# Patient Record
Sex: Male | Born: 1945 | Race: White | Hispanic: No | Marital: Married | Smoking: Never smoker
Health system: Southern US, Community
[De-identification: ages and names within clinical notes are randomized; demographics above are authoritative.]

## PROBLEM LIST (undated history)

## (undated) DIAGNOSIS — G4700 Insomnia, unspecified: Secondary | ICD-10-CM

## (undated) DIAGNOSIS — G4730 Sleep apnea, unspecified: Secondary | ICD-10-CM

## (undated) DIAGNOSIS — I2510 Atherosclerotic heart disease of native coronary artery without angina pectoris: Secondary | ICD-10-CM

## (undated) DIAGNOSIS — I10 Essential (primary) hypertension: Secondary | ICD-10-CM

## (undated) DIAGNOSIS — K439 Ventral hernia without obstruction or gangrene: Secondary | ICD-10-CM

## (undated) DIAGNOSIS — J449 Chronic obstructive pulmonary disease, unspecified: Secondary | ICD-10-CM

## (undated) DIAGNOSIS — K589 Irritable bowel syndrome without diarrhea: Secondary | ICD-10-CM

## (undated) DIAGNOSIS — F329 Major depressive disorder, single episode, unspecified: Secondary | ICD-10-CM

## (undated) DIAGNOSIS — E785 Hyperlipidemia, unspecified: Secondary | ICD-10-CM

## (undated) DIAGNOSIS — F1011 Alcohol abuse, in remission: Secondary | ICD-10-CM

## (undated) DIAGNOSIS — K219 Gastro-esophageal reflux disease without esophagitis: Secondary | ICD-10-CM

## (undated) DIAGNOSIS — K2270 Barrett's esophagus without dysplasia: Secondary | ICD-10-CM

## (undated) DIAGNOSIS — K2970 Gastritis, unspecified, without bleeding: Secondary | ICD-10-CM

## (undated) DIAGNOSIS — F32A Depression, unspecified: Secondary | ICD-10-CM

## (undated) DIAGNOSIS — F419 Anxiety disorder, unspecified: Secondary | ICD-10-CM

## (undated) HISTORY — DX: Gastritis, unspecified, without bleeding: K29.70

## (undated) HISTORY — DX: Sleep apnea, unspecified: G47.30

## (undated) HISTORY — DX: Major depressive disorder, single episode, unspecified: F32.9

## (undated) HISTORY — DX: Irritable bowel syndrome, unspecified: K58.9

## (undated) HISTORY — DX: Hyperlipidemia, unspecified: E78.5

## (undated) HISTORY — DX: Depression, unspecified: F32.A

## (undated) HISTORY — DX: Alcohol abuse, in remission: F10.11

## (undated) HISTORY — DX: Gastro-esophageal reflux disease without esophagitis: K21.9

## (undated) HISTORY — DX: Barrett's esophagus without dysplasia: K22.70

## (undated) HISTORY — PX: NECK SURGERY: SHX720

## (undated) HISTORY — DX: Atherosclerotic heart disease of native coronary artery without angina pectoris: I25.10

## (undated) HISTORY — DX: Chronic obstructive pulmonary disease, unspecified: J44.9

## (undated) HISTORY — DX: Anxiety disorder, unspecified: F41.9

## (undated) HISTORY — PX: ESOPHAGOGASTRODUODENOSCOPY: SHX1529

## (undated) HISTORY — PX: HERNIA REPAIR: SHX51

## (undated) HISTORY — DX: Ventral hernia without obstruction or gangrene: K43.9

## (undated) HISTORY — DX: Insomnia, unspecified: G47.00

## (undated) HISTORY — DX: Essential (primary) hypertension: I10

---

## 1983-05-04 ENCOUNTER — Encounter (INDEPENDENT_AMBULATORY_CARE_PROVIDER_SITE_OTHER): Payer: Self-pay | Admitting: Gastroenterology

## 1983-07-03 HISTORY — PX: NISSEN FUNDOPLICATION: SHX2091

## 1987-05-20 ENCOUNTER — Encounter (INDEPENDENT_AMBULATORY_CARE_PROVIDER_SITE_OTHER): Payer: Self-pay | Admitting: Gastroenterology

## 1988-06-06 ENCOUNTER — Encounter (INDEPENDENT_AMBULATORY_CARE_PROVIDER_SITE_OTHER): Payer: Self-pay | Admitting: Gastroenterology

## 1988-11-27 ENCOUNTER — Encounter (INDEPENDENT_AMBULATORY_CARE_PROVIDER_SITE_OTHER): Payer: Self-pay | Admitting: Gastroenterology

## 1990-02-05 ENCOUNTER — Encounter (INDEPENDENT_AMBULATORY_CARE_PROVIDER_SITE_OTHER): Payer: Self-pay | Admitting: Gastroenterology

## 1992-06-09 ENCOUNTER — Encounter (INDEPENDENT_AMBULATORY_CARE_PROVIDER_SITE_OTHER): Payer: Self-pay | Admitting: Gastroenterology

## 1992-06-10 ENCOUNTER — Encounter (INDEPENDENT_AMBULATORY_CARE_PROVIDER_SITE_OTHER): Payer: Self-pay | Admitting: Gastroenterology

## 1993-07-02 HISTORY — PX: TUMOR REMOVAL: SHX12

## 1993-07-02 HISTORY — PX: COLECTOMY: SHX59

## 1994-04-17 ENCOUNTER — Encounter (INDEPENDENT_AMBULATORY_CARE_PROVIDER_SITE_OTHER): Payer: Self-pay | Admitting: Gastroenterology

## 1994-04-18 ENCOUNTER — Encounter (INDEPENDENT_AMBULATORY_CARE_PROVIDER_SITE_OTHER): Payer: Self-pay | Admitting: Gastroenterology

## 1994-05-30 ENCOUNTER — Encounter (INDEPENDENT_AMBULATORY_CARE_PROVIDER_SITE_OTHER): Payer: Self-pay | Admitting: *Deleted

## 1998-05-20 ENCOUNTER — Other Ambulatory Visit: Admission: RE | Admit: 1998-05-20 | Discharge: 1998-05-20 | Payer: Self-pay | Admitting: Gastroenterology

## 1998-05-20 ENCOUNTER — Encounter (INDEPENDENT_AMBULATORY_CARE_PROVIDER_SITE_OTHER): Payer: Self-pay | Admitting: Gastroenterology

## 2000-03-02 HISTORY — PX: GYNECOMASTIA EXCISION: SHX5170

## 2001-05-01 ENCOUNTER — Ambulatory Visit (HOSPITAL_BASED_OUTPATIENT_CLINIC_OR_DEPARTMENT_OTHER): Admission: RE | Admit: 2001-05-01 | Discharge: 2001-05-01 | Payer: Self-pay | Admitting: Family Medicine

## 2001-07-31 ENCOUNTER — Ambulatory Visit (HOSPITAL_BASED_OUTPATIENT_CLINIC_OR_DEPARTMENT_OTHER): Admission: RE | Admit: 2001-07-31 | Discharge: 2001-07-31 | Payer: Self-pay | Admitting: Pulmonary Disease

## 2004-05-12 ENCOUNTER — Ambulatory Visit: Payer: Self-pay | Admitting: Family Medicine

## 2004-09-27 ENCOUNTER — Ambulatory Visit: Payer: Self-pay | Admitting: Family Medicine

## 2006-04-11 ENCOUNTER — Ambulatory Visit: Payer: Self-pay | Admitting: Family Medicine

## 2006-04-11 LAB — CONVERTED CEMR LAB: PSA: 0.8 ng/mL

## 2007-05-06 ENCOUNTER — Telehealth: Payer: Self-pay | Admitting: Family Medicine

## 2007-06-17 ENCOUNTER — Encounter: Payer: Self-pay | Admitting: Family Medicine

## 2007-06-17 DIAGNOSIS — G4700 Insomnia, unspecified: Secondary | ICD-10-CM

## 2007-06-17 DIAGNOSIS — F411 Generalized anxiety disorder: Secondary | ICD-10-CM | POA: Insufficient documentation

## 2007-06-17 DIAGNOSIS — K219 Gastro-esophageal reflux disease without esophagitis: Secondary | ICD-10-CM

## 2007-06-17 DIAGNOSIS — E785 Hyperlipidemia, unspecified: Secondary | ICD-10-CM

## 2007-06-17 DIAGNOSIS — K439 Ventral hernia without obstruction or gangrene: Secondary | ICD-10-CM | POA: Insufficient documentation

## 2007-06-17 DIAGNOSIS — J309 Allergic rhinitis, unspecified: Secondary | ICD-10-CM | POA: Insufficient documentation

## 2007-06-17 DIAGNOSIS — I10 Essential (primary) hypertension: Secondary | ICD-10-CM | POA: Insufficient documentation

## 2007-06-17 DIAGNOSIS — G4730 Sleep apnea, unspecified: Secondary | ICD-10-CM | POA: Insufficient documentation

## 2007-06-17 DIAGNOSIS — F329 Major depressive disorder, single episode, unspecified: Secondary | ICD-10-CM

## 2007-06-17 DIAGNOSIS — F1011 Alcohol abuse, in remission: Secondary | ICD-10-CM | POA: Insufficient documentation

## 2007-06-17 DIAGNOSIS — K589 Irritable bowel syndrome without diarrhea: Secondary | ICD-10-CM | POA: Insufficient documentation

## 2007-06-18 ENCOUNTER — Ambulatory Visit: Payer: Self-pay | Admitting: Family Medicine

## 2007-06-18 LAB — CONVERTED CEMR LAB: Rapid Strep: NEGATIVE

## 2007-07-03 HISTORY — PX: CORONARY ARTERY BYPASS GRAFT: SHX141

## 2007-09-03 ENCOUNTER — Telehealth: Payer: Self-pay | Admitting: Family Medicine

## 2007-11-18 ENCOUNTER — Encounter: Payer: Self-pay | Admitting: Family Medicine

## 2007-11-19 ENCOUNTER — Ambulatory Visit: Payer: Self-pay | Admitting: Surgery

## 2007-11-19 ENCOUNTER — Inpatient Hospital Stay (HOSPITAL_COMMUNITY): Admission: AD | Admit: 2007-11-19 | Discharge: 2007-11-24 | Payer: Self-pay | Admitting: Surgery

## 2007-11-19 ENCOUNTER — Encounter: Payer: Self-pay | Admitting: Family Medicine

## 2007-11-19 ENCOUNTER — Encounter: Payer: Self-pay | Admitting: Surgery

## 2007-11-19 ENCOUNTER — Ambulatory Visit: Payer: Self-pay | Admitting: Cardiovascular Disease

## 2007-12-09 ENCOUNTER — Encounter: Payer: Self-pay | Admitting: Family Medicine

## 2007-12-16 ENCOUNTER — Ambulatory Visit: Payer: Self-pay | Admitting: Surgery

## 2007-12-16 ENCOUNTER — Encounter: Admission: RE | Admit: 2007-12-16 | Discharge: 2007-12-16 | Payer: Self-pay | Admitting: Surgery

## 2008-01-07 ENCOUNTER — Telehealth: Payer: Self-pay | Admitting: Family Medicine

## 2008-05-05 ENCOUNTER — Telehealth: Payer: Self-pay | Admitting: Family Medicine

## 2008-08-16 ENCOUNTER — Telehealth: Payer: Self-pay | Admitting: Family Medicine

## 2008-08-16 DIAGNOSIS — I2510 Atherosclerotic heart disease of native coronary artery without angina pectoris: Secondary | ICD-10-CM | POA: Insufficient documentation

## 2008-08-27 ENCOUNTER — Telehealth (INDEPENDENT_AMBULATORY_CARE_PROVIDER_SITE_OTHER): Payer: Self-pay | Admitting: *Deleted

## 2008-08-31 ENCOUNTER — Ambulatory Visit: Payer: Self-pay | Admitting: Cardiovascular Disease

## 2008-10-21 ENCOUNTER — Ambulatory Visit: Payer: Self-pay | Admitting: Family Medicine

## 2008-10-27 LAB — CONVERTED CEMR LAB
ALT: 32 units/L (ref 0–53)
AST: 30 units/L (ref 0–37)
Albumin: 4.4 g/dL (ref 3.5–5.2)
BUN: 23 mg/dL (ref 6–23)
Basophils Relative: 0.7 % (ref 0.0–3.0)
Chloride: 105 meq/L (ref 96–112)
Creatinine, Ser: 1.3 mg/dL (ref 0.4–1.5)
Eosinophils Relative: 1.5 % (ref 0.0–5.0)
GFR calc non Af Amer: 59.21 mL/min (ref >60)
Glucose, Bld: 110 mg/dL — ABNORMAL HIGH (ref 70–99)
HDL: 33.1 mg/dL — ABNORMAL LOW (ref >39.00)
Hemoglobin: 18 g/dL — ABNORMAL HIGH (ref 13.0–17.0)
MCV: 85.8 fL (ref 78.0–100.0)
Monocytes Absolute: 0.5 10*3/uL (ref 0.1–1.0)
Neutro Abs: 4.6 10*3/uL (ref 1.4–7.7)
Neutrophils Relative %: 62.6 % (ref 43.0–77.0)
Potassium: 4.4 meq/L (ref 3.5–5.1)
RBC: 6.07 M/uL — ABNORMAL HIGH (ref 4.22–5.81)
TSH: 1.08 microintl units/mL (ref 0.35–5.50)
WBC: 7.4 10*3/uL (ref 4.5–10.5)

## 2008-12-06 ENCOUNTER — Ambulatory Visit: Payer: Self-pay | Admitting: Gastroenterology

## 2008-12-06 DIAGNOSIS — K2270 Barrett's esophagus without dysplasia: Secondary | ICD-10-CM

## 2008-12-07 DIAGNOSIS — J4489 Other specified chronic obstructive pulmonary disease: Secondary | ICD-10-CM | POA: Insufficient documentation

## 2008-12-07 DIAGNOSIS — J449 Chronic obstructive pulmonary disease, unspecified: Secondary | ICD-10-CM

## 2008-12-09 ENCOUNTER — Encounter (INDEPENDENT_AMBULATORY_CARE_PROVIDER_SITE_OTHER): Payer: Self-pay | Admitting: *Deleted

## 2008-12-10 ENCOUNTER — Ambulatory Visit: Payer: Self-pay | Admitting: Family Medicine

## 2008-12-14 ENCOUNTER — Telehealth: Payer: Self-pay | Admitting: Family Medicine

## 2008-12-14 LAB — CONVERTED CEMR LAB
AST: 23 units/L (ref 0–37)
LDL Cholesterol: 80 mg/dL (ref 0–99)
Total CHOL/HDL Ratio: 5

## 2008-12-27 ENCOUNTER — Telehealth: Payer: Self-pay | Admitting: Family Medicine

## 2008-12-31 ENCOUNTER — Encounter: Payer: Self-pay | Admitting: Gastroenterology

## 2008-12-31 ENCOUNTER — Ambulatory Visit: Payer: Self-pay | Admitting: Gastroenterology

## 2009-01-05 ENCOUNTER — Encounter: Payer: Self-pay | Admitting: Gastroenterology

## 2009-01-27 ENCOUNTER — Telehealth: Payer: Self-pay | Admitting: Gastroenterology

## 2009-03-18 ENCOUNTER — Ambulatory Visit: Payer: Self-pay | Admitting: Cardiovascular Disease

## 2009-03-31 ENCOUNTER — Telehealth: Payer: Self-pay | Admitting: Family Medicine

## 2009-04-27 ENCOUNTER — Telehealth: Payer: Self-pay | Admitting: Family Medicine

## 2009-06-09 ENCOUNTER — Ambulatory Visit: Payer: Self-pay | Admitting: Family Medicine

## 2009-08-31 ENCOUNTER — Telehealth: Payer: Self-pay | Admitting: Family Medicine

## 2009-12-29 ENCOUNTER — Telehealth: Payer: Self-pay | Admitting: Family Medicine

## 2010-01-18 ENCOUNTER — Encounter: Payer: Self-pay | Admitting: Family Medicine

## 2010-01-19 ENCOUNTER — Ambulatory Visit: Payer: Self-pay | Admitting: Family Medicine

## 2010-01-19 DIAGNOSIS — N410 Acute prostatitis: Secondary | ICD-10-CM

## 2010-01-19 LAB — CONVERTED CEMR LAB
Casts: 0 /lpf
Glucose, Urine, Semiquant: NEGATIVE
Specific Gravity, Urine: >=1.03
WBC Urine, dipstick: NEGATIVE
Yeast, UA: 0
pH: 5

## 2010-02-06 ENCOUNTER — Telehealth: Payer: Self-pay | Admitting: Cardiovascular Disease

## 2010-02-21 ENCOUNTER — Telehealth: Payer: Self-pay | Admitting: Family Medicine

## 2010-03-02 ENCOUNTER — Ambulatory Visit: Payer: Self-pay | Admitting: Family Medicine

## 2010-03-02 DIAGNOSIS — D485 Neoplasm of uncertain behavior of skin: Secondary | ICD-10-CM

## 2010-03-02 DIAGNOSIS — N200 Calculus of kidney: Secondary | ICD-10-CM | POA: Insufficient documentation

## 2010-03-02 DIAGNOSIS — N390 Urinary tract infection, site not specified: Secondary | ICD-10-CM

## 2010-03-02 LAB — CONVERTED CEMR LAB
Bilirubin Urine: NEGATIVE
Casts: 0 /lpf
Ketones, urine, test strip: NEGATIVE
Mucus, UA: 0
Nitrite: NEGATIVE
RBC / HPF: 0
Specific Gravity, Urine: 1.025
Yeast, UA: 0

## 2010-03-07 LAB — CONVERTED CEMR LAB
Basophils Absolute: 0 10*3/uL (ref 0.0–0.1)
Eosinophils Absolute: 0.1 10*3/uL (ref 0.0–0.7)
HCT: 48.9 % (ref 39.0–52.0)
Hemoglobin: 16.9 g/dL (ref 13.0–17.0)
Lymphs Abs: 1.8 10*3/uL (ref 0.7–4.0)
MCHC: 34.5 g/dL (ref 30.0–36.0)
Neutro Abs: 3.9 10*3/uL (ref 1.4–7.7)
Platelets: 229 10*3/uL (ref 150.0–400.0)
RDW: 12.9 % (ref 11.5–14.6)

## 2010-03-16 ENCOUNTER — Ambulatory Visit: Payer: Self-pay | Admitting: Cardiovascular Disease

## 2010-03-17 ENCOUNTER — Encounter: Payer: Self-pay | Admitting: Cardiovascular Disease

## 2010-03-27 LAB — CONVERTED CEMR LAB
ALT: 34 U/L
AST: 30 U/L
Albumin: 4.5 g/dL
Alkaline Phosphatase: 61 U/L
Bilirubin, Direct: 0.2 mg/dL
Cholesterol: 205 mg/dL — ABNORMAL HIGH
HDL: 37 mg/dL — ABNORMAL LOW
Indirect Bilirubin: 0.8 mg/dL
LDL Cholesterol: 91 mg/dL
Total Bilirubin: 1 mg/dL
Total CHOL/HDL Ratio: 5.5
Total Protein: 7.1 g/dL
Triglycerides: 384 mg/dL — ABNORMAL HIGH
VLDL: 77 mg/dL — ABNORMAL HIGH

## 2010-04-27 ENCOUNTER — Telehealth: Payer: Self-pay | Admitting: Family Medicine

## 2010-06-14 ENCOUNTER — Telehealth: Payer: Self-pay | Admitting: Cardiovascular Disease

## 2010-06-15 ENCOUNTER — Ambulatory Visit: Payer: Self-pay | Admitting: Cardiovascular Disease

## 2010-07-23 ENCOUNTER — Encounter: Payer: Self-pay | Admitting: Surgery

## 2010-07-27 ENCOUNTER — Ambulatory Visit
Admission: RE | Admit: 2010-07-27 | Discharge: 2010-07-27 | Payer: Self-pay | Source: Home / Self Care | Attending: Cardiovascular Disease | Admitting: Cardiovascular Disease

## 2010-07-27 ENCOUNTER — Encounter: Payer: Self-pay | Admitting: Cardiovascular Disease

## 2010-08-01 NOTE — Progress Notes (Signed)
Summary: Lorazepam  Phone Note Refill Request Message from:  Fax from Pharmacy on August 31, 2009 9:53 AM  Refills Requested: Medication #1:  LORAZEPAM 2 MG TABS 1 by mouth three times a day [facility] [address]  Phone:   [phone]   Method Requested: Telephone to Pharmacy Initial call taken by: Delilah Shan CMA Duncan Dull),  August 31, 2009 9:53 AM  Follow-up for Phone Call        px written on EMR for call in  Follow-up by: Judith Part MD,  August 31, 2009 10:23 AM  Additional Follow-up for Phone Call Additional follow up Details #1::        Medication phoned to pharmacy.  Additional Follow-up by: Delilah Shan CMA (AAMA),  August 31, 2009 12:01 PM    New/Updated Medications: LORAZEPAM 2 MG TABS (LORAZEPAM) 1 by mouth three times a day Prescriptions: LORAZEPAM 2 MG TABS (LORAZEPAM) 1 by mouth three times a day  #90 x 3   Entered and Authorized by:   Judith Part MD   Signed by:   Delilah Shan CMA (AAMA) on 08/31/2009   Method used:   Telephoned to ...       [facility] (mail-order)       [address]       [place], [place]  [address]       Ph: [phone]       Fax: [phone]   RxID:   [id number]

## 2010-08-01 NOTE — Letter (Signed)
Summary: Custom - Lipid  Architectural technologist at [facility] [address]   [place], [place] [address]   Phone: [phone]  Fax: [phone]     March 17, 2010 MRN: [id number]   Seth David [address] [place], [place]  [address]   Dear Mr. LAVALLEE,  We have reviewed your cholesterol results.  They are as follows:     Total Cholesterol:    205 (Desirable: less than 150)       HDL  Cholesterol:     37  (Desirable: greater than 40 for men and 50 for women)       LDL Cholesterol:       91  (Desirable: less than 70 for moderate to high risk)       Triglycerides:       384  (Desirable: less than 150)  Our recommendations include: PLEASE CALL OUR OFFICE FOR RECOMMENDATIONS ABOUT MEDICATIONS.    Call our office at the number listed above if you have any questions.  Lowering your LDL cholesterol is important, but it is only one of a large number of "risk factors" that may indicate that you are at risk for heart disease, stroke or other complications of hardening of the arteries.  Other risk factors include:   A.  Cigarette Smoking* B.  High Blood Pressure* C.  Obesity* D.   Low HDL Cholesterol (see yours above)* E.   Diabetes Mellitus (higher risk if your is uncontrolled) F.  Family history of premature heart disease G.  Previous history of stroke or cardiovascular disease    *These are risk factors YOU HAVE CONTROL OVER.  For more information, visit .  There is now evidence that lowering the TOTAL CHOLESTEROL AND LDL CHOLESTEROL can reduce the risk of heart disease.  The American Heart Association recommends the following guidelines for the treatment of elevated cholesterol:  1.  If there is now current heart disease and less than two risk factors, TOTAL CHOLESTEROL should be less than 200 and LDL CHOLESTEROL should be less than 100. 2.  If there is current heart disease or two or more risk factors, TOTAL CHOLESTEROL should be less than 200 and LDL CHOLESTEROL  should be less than 70.  A diet low in cholesterol, saturated fat, and calories is the cornerstone of treatment for elevated cholesterol.  Cessation of smoking and exercise are also important in the management of elevated cholesterol and preventing vascular disease.  Studies have shown that 30 to 60 minutes of physical activity most days can help lower blood pressure, lower cholesterol, and keep your weight at a healthy level.  Drug therapy is used when cholesterol levels do not respond to therapeutic lifestyle changes (smoking cessation, diet, and exercise) and remains unacceptably high.  If medication is started, it is important to have you levels checked periodically to evaluate the need for further treatment options.  Thank you,     [facility] Team         Appended Document: Custom - Lipid could try vytorin 10/40. Could come in for samples  Appended Document: Custom - Lipid    Clinical Lists Changes  Medications: Changed medication from PRAVACHOL 80 MG TABS (PRAVASTATIN SODIUM) 1 by mouth once daily to VYTORIN 10-40 MG TABS (EZETIMIBE-SIMVASTATIN) Take one tablet by mouth daily at bedtime - Signed Rx of VYTORIN 10-40 MG TABS (EZETIMIBE-SIMVASTATIN) Take one tablet by mouth daily at bedtime;  #90 x 4;  Signed;  Entered by: Benedict Needy, RN;  Authorized by: Dossie Arbour MD;  Method used: Electronically to [facility]*, [address] [place], [place], [place]  [address], Ph: [phone], Fax: [phone]    Prescriptions: VYTORIN 10-40 MG TABS (EZETIMIBE-SIMVASTATIN) Take one tablet by mouth daily at bedtime  #90 x 4   Entered by:   Benedict Needy, RN   Authorized by:   Dossie Arbour MD   Signed by:   Benedict Needy, RN on 04/06/2010   Method used:   Electronically to        [facility]* (mail-order)       [address] [place], [place]  [address]       Ph: [phone]       Fax: [phone]   RxID:   [id number]

## 2010-08-01 NOTE — Miscellaneous (Signed)
Summary: Controlled Substance Agreement  Controlled Substance Agreement   Imported By: Lanelle Bal 01/24/2010 12:11:23  _____________________________________________________________________  External Attachment:    Type:   Image     Comment:   External Document

## 2010-08-01 NOTE — Assessment & Plan Note (Signed)
Summary: 1 yr fu mh   Visit Type:  Follow-up Referring Provider:  Roxy Manns, MD Primary Provider:  Roxy Manns, MD  CC:  Denies chest pain or shortness of breath.  Was at the ER three weeks ago for a bladder infection; states could not urinate.Seth David  History of Present Illness: 65 year-old male who sustained NSTEMI May 2009, cardiac catheterization at [facility] showing severe three-vessel disease, treated with multivessel CABG back:. 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 Took about one year for numbness to resolve.  he continues to have some numbness in the tips of his fingers at times. He has had problems with IBS though this is better with lorazepam t.i.d. He is now able to take aspirin due to ringing in his years. He has a history of colectomy. He had a recent urinary tract infection requiring antibiotics and has had no problem since that time. He is relatively active with no symptoms of chest pain or shortness of breath.The patient denies orthopnea, PND, edema, palpitations, lightheadedness, or syncope.  He has problems with sleep and has tried [place] in the past with good relief. He has tried Benadryl and other over-the-counter medications but continues to have problems falling asleep.  cardiac catheterization showed a long hazy proximal LAD lesion of 60-70%, high grade small diagonal branch, 85% proximal left circumflex before a large OM, 50% proximal disease in the OM, RCA was relatively small with 70% mid vessel disease, ejection fraction 50%  EKG shows normal sinus rhythm with rate of 86 beats per minute, no significant ST or T wave changes.  Current Medications (verified): 1)  Lorazepam 2 Mg Tabs (Lorazepam) .Seth David.. 1 By Mouth Three Times A Day 2)  Lotensin 20 Mg Tabs (Benazepril Hcl) .... Take 1 Tablet By Mouth Two Times A Day 3)  Pravachol 80 Mg Tabs (Pravastatin  Sodium) .Seth David.. 1 By Mouth Once Daily 4)  Plavix 75 Mg Tabs (Clopidogrel Bisulfate) .... Take 1 Tablet By Mouth Once A Day 5)  Metamucil 48.57 % Powd (Psyllium) .Seth David.. 1 Scoop Daily 6)  Benadryl 25 Mg Caps (Diphenhydramine Hcl) .... Take 1 By Mouth At Bedtime 7)  Aleve 220 Mg Tabs (Naproxen Sodium) .... As Needed 8)  Multivitamins  Tabs (Multiple Vitamin) .... Take 1 By Mouth Once Daily 9)  Metoprolol Succinate 25 Mg Xr24h-Tab (Metoprolol Succinate) .... Take One Half Tablet By Mouth Daily 10)  Cialis 10 Mg Tabs (Tadalafil) .... Take One By Mouth As Needed Prior To Activity  Allergies (verified): 1)  Lipitor 2)  Paxil 3)  Prozac 4)  Asa 5)  Codeine 6)  Morphine 7)  * Trazadone  Past History:  Past Medical History: Last updated: 01/19/2010 CAD (ICD-414.00) s/p CABG 2009 HYPERTENSION (ICD-401.9) HYPERLIPIDEMIA (ICD-272.4) BARRETTS ESOPHAGUS (ICD-530.85) COPD (ICD-496) SLEEP APNEA (ICD-780.57) INSOMNIA (ICD-780.52) VENTRAL HERNIA (ICD-553.20) IRRITABLE BOWEL SYNDROME (ICD-564.1) diverticulitis with surgery in the past  past hx of frequent uti and prostatitis before his colon surgery GERD (ICD-530.81) DEPRESSION (ICD-311) ANXIETY (ICD-300.00) ALLERGIC RHINITIS (ICD-477.9) Hx of ALCOHOL ABUSE (ICD-305.00) gastritis  Past Surgical History: Last updated: 01/06/2009 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 Nissen fundoplication 1985 Colonoscopy- ok, spacticity of anastamosis  (05/1998) EGD Ventral hernia repair (03/2000, 10/2000) Gynecomastia- focal, surgery (03/2000) Rest/ stress cardiolite- neg (03/2001) Neck fusion 5/09 hosp MI-- cardiac cath with 3 vessel  CAD CABG 5/09 7/10 EGD- barrett's esophagus, stricture, gastritis   Family History: Last updated: 12/06/2008 Father: heart disease, stroke- deceased Mother: OP Siblings: 1 sister- anxiety, back problems No FH of Colon Cancer: Family History of Heart Disease: Father, Uncles  x 5 Family History of Diabetes: Paternal Uncle, Maternal Uncle  Social History: Last updated: 12/07/2008 Marital Status: Married Children: 3 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  Alcohol Use - yes-rare Daily Caffeine Use-1 cup daily Illicit Drug Use - no Patient gets regular exercise. Regular Exercise - yes  Risk Factors: Exercise: yes (12/07/2008)  Risk Factors: Smoking Status: never (12/06/2008)  Review of Systems  The patient denies fever, weight loss, weight gain, vision loss, decreased hearing, hoarseness, chest pain, syncope, dyspnea on exertion, peripheral edema, prolonged cough, abdominal pain, incontinence, muscle weakness, depression, and enlarged lymph nodes.         Difficulty falling asleep  Vital Signs:  Patient profile:   65 year old male Height:      71 inches Weight:      218 pounds BMI:     30.51 BP sitting:   132 / 98  (left arm) Cuff size:   regular  Vitals Entered By: Bishop Dublin, CMA (March 16, 2010 10:11 AM)  Physical Exam  General:  overweight but generally well appearing  Head:  normocephalic, atraumatic, and no abnormalities observed.   Neck:  No deformities, masses, or tenderness noted. Chest Wall:  No deformities, masses, tenderness or gynecomastia noted. Abdomen:  no suprapubic tenderness or fullness felt  Msk:  Back normal, normal gait. Muscle strength and tone normal. Pulses:  pulses normal in all 4 extremities Extremities:  No clubbing or cyanosis. Neurologic:  Alert and oriented x 3. Skin:  Intact without lesions or rashes. Psych:  Normal affect.   Impression & Recommendations:  Problem # 1:  CAD (ICD-414.00) history of CAD with bypass. No problems since that time. His lipids last year were well controlled, at goal. No further testing at this time. We have suggested as he is unable to tolerate aspirin due to 2 tinnitus, that we change his Plavix to effient.  He will check the price and let us  know if he is able to do this.  His updated medication list for this problem includes:    Lotensin 20 Mg Tabs (Benazepril hcl) .Seth David... Take 1 tablet by mouth two times a day    Plavix 75 Mg Tabs (Clopidogrel bisulfate) .Seth David... Take 1 tablet by mouth once a day    Metoprolol Succinate 25 Mg Xr24h-tab (Metoprolol succinate) .Seth David... Take one half tablet by mouth daily    Effient 10 Mg Tabs (Prasugrel hcl) .Seth David... Take one tablet by mouth daily  Orders: EKG w/ Interpretation (93000)  Problem # 2:  HYPERTENSION (ICD-401.9) Blood pressure is reasonably well controlled on his current medication regimen. No changes were made at this time.  His updated medication list for this problem includes:    Lotensin 20 Mg Tabs (Benazepril hcl) .Seth David... Take 1 tablet by mouth two times a day    Metoprolol Succinate 25 Mg Xr24h-tab (Metoprolol succinate) .Seth David... Take one half tablet by mouth daily  Problem # 3:  HYPERLIPIDEMIA (ICD-272.4) cholesterol was at goal and 2010. We will try to obtain some blood today for a routine lipid and LFTs checked. Goal LDL is less than 70.  His updated medication list for this problem includes:    Pravachol 80 Mg Tabs (Pravastatin sodium) .Seth David... 1 by mouth once daily  Orders: T-Lipid Profile [id number]) T-Hepatic Function [id number])  Problem # 4:  INSOMNIA (ICD-780.52) He is having problems with insomnia. I've asked him to increase his exercise. I also asked him to talk with Dr. Milinda Antis about possible medication alternatives.  Patient Instructions: 1)  Your physician recommends that you have a FASTING lipid profile today (LIP/LFT)  2)  Your physician wants you to follow-up in:   6 months You will receive a reminder letter in the mail two months in advance. If you don't receive a letter, please call our office to schedule the follow-up appointment. 3)  Your physician has recommended you make the following change in your medication: STOP plavix START effient if the price is to your  liking.  Prescriptions: EFFIENT 10 MG TABS (PRASUGREL HCL) Take one tablet by mouth daily  #90 x 4   Entered by:   Benedict Needy, RN   Authorized by:   Dossie Arbour MD   Signed by:   Benedict Needy, RN on 03/16/2010   Method used:   Print then Give to Patient   RxID:   [id number] EFFIENT 10 MG TABS (PRASUGREL HCL) Take one tablet by mouth daily  #30 x 12   Entered by:   Benedict Needy, RN   Authorized by:   Dossie Arbour MD   Signed by:   Benedict Needy, RN on 03/16/2010   Method used:   Print then Give to Patient   RxID:   [id number]

## 2010-08-01 NOTE — Progress Notes (Signed)
Summary: refill request for lorazepam  Phone Note Refill Request Message from:  Fax from Pharmacy  Refills Requested: Medication #1:  LORAZEPAM 2 MG TABS 1 by mouth three times a day   Last Refilled: 11/29/2009 [facility] [address] [phone].  Initial call taken by: Lowella Petties CMA,  December 29, 2009 9:44 AM  Follow-up for Phone Call        px written on EMR for call in  Follow-up by: Judith Part MD,  December 29, 2009 12:57 PM  Additional Follow-up for Phone Call Additional follow up Details #1::        Medication phoned to pharmacy.  Additional Follow-up by: Delilah Shan CMA (AAMA),  December 29, 2009 5:14 PM    New/Updated Medications: LORAZEPAM 2 MG TABS (LORAZEPAM) 1 by mouth three times a day Prescriptions: LORAZEPAM 2 MG TABS (LORAZEPAM) 1 by mouth three times a day  #90 x 3   Entered and Authorized by:   Judith Part MD   Signed by:   Delilah Shan CMA (AAMA) on 12/29/2009   Method used:   Telephoned to ...       [facility]  [place] [id number] (retail)       [address]       [place], [place]  [address]       Ph: [phone]       Fax: [phone]   RxID:   [id number]

## 2010-08-01 NOTE — Progress Notes (Signed)
Summary: REFILL  Phone Note Refill Request Message from:  Patient on February 06, 2010 4:26 PM  Refills Requested: Medication #1:  LOTENSIN 20 MG TABS Take 1 tablet by mouth two times a day  Medication #2:  PLAVIX 75 MG TABS Take 1 tablet by mouth once a day  Medication #3:  PRAVACHOL 80 MG TABS 1 by mouth once daily  Medication #4:  METOPROLOL SUCCINATE 25 MG XR24H-TAB Take one half tablet by mouth daily 90 DAY SUPPLYTenna Child [phone]  Initial call taken by: [place],  February 06, 2010 4:28 PM    Prescriptions: PRAVACHOL 80 MG TABS (PRAVASTATIN SODIUM) 1 by mouth once daily  #90 x 3   Entered by:   Hardin Negus, RMA   Authorized by:   Norva Karvonen, MD   Signed by:   Hardin Negus, RMA on 02/07/2010   Method used:   Electronically to        [facility]* (mail-order)       [address] [place], [place]  [address]       Ph: [phone]       Fax: [phone]   RxID:   [id number] METOPROLOL SUCCINATE 25 MG XR24H-TAB (METOPROLOL SUCCINATE) Take one half tablet by mouth daily  #45 x 3   Entered by:   Hardin Negus, RMA   Authorized by:   Norva Karvonen, MD   Signed by:   Hardin Negus, RMA on 02/07/2010   Method used:   Electronically to        [facility]* (mail-order)       [address] [place], [place]  [address]       Ph: [phone]       Fax: [phone]   RxID:   [id number] PRAVACHOL 80 MG TABS (PRAVASTATIN SODIUM) 1 by mouth once daily  #60 Tablet x 0   Entered by:   Hardin Negus, RMA   Authorized by:   Norva Karvonen, MD   Signed by:   Hardin Negus, RMA on 02/07/2010   Method used:   Electronically to        [facility]* (mail-order)       [address] [place], [place]  [address]       Ph: [phone]       Fax: [phone]   RxID:   [id number] PLAVIX 75 MG TABS (CLOPIDOGREL BISULFATE) Take 1 tablet by mouth once a day  #90 x 3   Entered by:    Hardin Negus, RMA   Authorized by:   Norva Karvonen, MD   Signed by:   Hardin Negus, RMA on 02/07/2010   Method used:   Electronically to        [facility]* (mail-order)       [address] [place], [place]  [address]       Ph: [phone]       Fax: [phone]   RxID:   [id number] LOTENSIN 20 MG TABS (BENAZEPRIL HCL) Take 1 tablet by mouth two times a day  #180 x 3   Entered by:   Hardin Negus, RMA   Authorized by:   Norva Karvonen, MD   Signed by:   Hardin Negus, RMA on 02/07/2010   Method used:   Electronically to  [facility]* (mail-order)       [address] [place], [place]  [address]       Ph: [phone]       Fax: [phone]   RxID:   [id number]

## 2010-08-01 NOTE — Progress Notes (Signed)
Summary: can't urinate  Phone Note Call from Patient Call back at Home Phone [phone]   Caller: Patient Call For: Judith Part MD Summary of Call: Patient calling to let you know that he has been unable to pee in the last couple of hours. Patient states that he feels like he needs to pee, but can't get anything to come out. He also states that he was mashing on the end of his penis and noticed a little bit of blood coming out. He says that he is in alot of pain. I told him he should go to the emergency room. He wants me to ask you what you think he should do. Please advise.  Initial call taken by: Melody Comas,  February 21, 2010 4:48 PM  Follow-up for Phone Call        needs to go to ER, may need foley to void. Follow-up by: Eustaquio Boyden  MD,  February 21, 2010 4:50 PM  Additional Follow-up for Phone Call Additional follow up Details #1::        Patient notified. He is going to go to [facility].  Additional Follow-up by: Melody Comas,  February 21, 2010 4:53 PM    Additional Follow-up for Phone Call Additional follow up Details #2::    thanks. Follow-up by: Eustaquio Boyden  MD,  February 21, 2010 4:54 PM

## 2010-08-01 NOTE — Assessment & Plan Note (Signed)
Summary: BLADDER INFECTION?DLO   Vital Signs:  Patient profile:   65 year old male Height:      71 inches Weight:      226.50 pounds BMI:     31.70 Temp:     97.9 degrees F oral Pulse rate:   76 / minute Pulse rhythm:   regular BP sitting:   126 / 84 Cuff size:   large  Vitals Entered By: Lewanda Rife LPN (January 19, 2010 8:19 AM) CC: ?bladder infection, some burning upon urination witih frequency   History of Present Illness: is having some pains down deep  - feels like when he had bladder infx years ago -- before operation (before colon operation)  has had both prostate and bladder inf  symptoms for about a month  hurts suprapubically no rectal pain  no hesitancy at all  no burning  some frequency  no fever - but temp is up a bit from normal  does drink a fair amt of water  bad habit is cokes - up to 2 per day  has never had a kidney stone and no back pain   no STD exposure at all     has a spot on side of his face- has a scab- it keeps growing back     Allergies: 1)  Lipitor 2)  Paxil 3)  Prozac 4)  Asa 5)  Codeine 6)  Morphine 7)  * Trazadone  Past History:  Past Surgical History: Last updated: 01/06/2009 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 Nissen fundoplication 1985 Colonoscopy- ok, spacticity of anastamosis  (05/1998) EGD Ventral hernia repair (03/2000, 10/2000) Gynecomastia- focal, surgery (03/2000) Rest/ stress cardiolite- neg (03/2001) Neck fusion 5/09 hosp MI-- cardiac cath with 3 vessel CAD CABG 5/09 7/10 EGD- barrett's esophagus, stricture, gastritis   Family History: Last updated: 12/06/2008 Father: heart disease, stroke- deceased Mother: OP Siblings: 1 sister- anxiety, back problems No FH of Colon Cancer: Family History of Heart Disease: Father, Uncles x 5 Family History of Diabetes: Paternal Uncle, Maternal Uncle  Social History: Last updated: 12/07/2008 Marital Status:  Married Children: 3 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  Alcohol Use - yes-rare Daily Caffeine Use-1 cup daily Illicit Drug Use - no Patient gets regular exercise. Regular Exercise - yes  Risk Factors: Exercise: yes (12/07/2008)  Risk Factors: Smoking Status: never (12/06/2008)  Past Medical History: CAD (ICD-414.00) s/p CABG 2009 HYPERTENSION (ICD-401.9) HYPERLIPIDEMIA (ICD-272.4) BARRETTS ESOPHAGUS (ICD-530.85) COPD (ICD-496) SLEEP APNEA (ICD-780.57) INSOMNIA (ICD-780.52) VENTRAL HERNIA (ICD-553.20) IRRITABLE BOWEL SYNDROME (ICD-564.1) diverticulitis with surgery in the past  past hx of frequent uti and prostatitis before his colon surgery GERD (ICD-530.81) DEPRESSION (ICD-311) ANXIETY (ICD-300.00) ALLERGIC RHINITIS (ICD-477.9) Hx of ALCOHOL ABUSE (ICD-305.00) gastritis  Review of Systems General:  Denies chills, fatigue, fever, loss of appetite, and malaise. Eyes:  Denies blurring and eye irritation. CV:  Denies chest pain or discomfort, palpitations, and swelling of feet. Resp:  Denies cough, shortness of breath, and wheezing. GI:  Denies bloody stools, change in bowel habits, dark tarry stools, diarrhea, nausea, and vomiting. GU:  Denies discharge, genital sores, incontinence, and urinary hesitancy. MS:  Denies low back pain. Derm:  Denies itching, lesion(s), poor wound healing, and rash. Neuro:  Denies numbness and tingling. Psych:  mood is ok . Heme:  Denies abnormal bruising and bleeding.  Physical Exam  General:  overweight but generally well appearing  Head:  normocephalic, atraumatic, and no abnormalities observed.   Eyes:  vision grossly intact, pupils equal, pupils round, and pupils reactive to light.   Neck:  supple with full rom and no masses or thyromegally, no JVD or carotid bruit  Chest Wall:  No deformities, masses, tenderness or gynecomastia noted. Lungs:  Normal respiratory effort, chest expands symmetrically. Lungs  are clear to auscultation, no crackles or wheezes. Heart:  Normal rate and regular rhythm. S1 and S2 normal without gallop, murmur, click, rub or other extra sounds. Abdomen:  mild suprapubic tenderness without rebound or gaurding soft, normal bowel sounds, no abdominal hernia, no inguinal hernia, and no hepatomegaly.   Rectal:  No external abnormalities noted. Normal sphincter tone. No rectal masses or tenderness. Prostate:  prostate is unenlarged but boggy in texture minimal tenderness   Msk:  no CVA tenderness  Extremities:  No clubbing, cyanosis, edema, or deformity noted with normal full range of motion of all joints.   Neurologic:  sensation intact to light touch, gait normal, and DTRs symmetrical and normal.   Skin:  Intact without suspicious lesions or rashes Cervical Nodes:  No lymphadenopathy noted Inguinal Nodes:  No significant adenopathy Psych:  normal affect, talkative and pleasant    Impression & Recommendations:  Problem # 1:  PROSTATITIS, ACUTE (ICD-601.0) Assessment New  will tx with 10 days of high dose cipro and update disc water intake and avoidance of other bev update if worse or not imp   Orders: Prescription Created Electronically [id number])  Complete Medication List: 1)  Lorazepam 2 Mg Tabs (Lorazepam) .Marland Kitchen.. 1 by mouth three times a day 2)  Lotensin 20 Mg Tabs (Benazepril hcl) .... Take 1 tablet by mouth two times a day 3)  Pravachol 80 Mg Tabs (Pravastatin sodium) .Marland Kitchen.. 1 by mouth once daily 4)  Plavix 75 Mg Tabs (Clopidogrel bisulfate) .... Take 1 tablet by mouth once a day 5)  Metamucil 48.57 % Powd (Psyllium) .Marland Kitchen.. 1 scoop daily 6)  Benadryl 25 Mg Caps (Diphenhydramine hcl) .... Take 1 by mouth at bedtime 7)  Aleve 220 Mg Tabs (Naproxen sodium) .... As needed 8)  Multivitamins Tabs (Multiple vitamin) .... Take 1 by mouth once daily 9)  Metoprolol Succinate 25 Mg Xr24h-tab (Metoprolol succinate) .... Take one half tablet by mouth daily 10)  Cialis 10 Mg  Tabs (Tadalafil) .... Take one by mouth as needed prior to activity 11)  Cipro 500 Mg Tabs (Ciprofloxacin hcl) .Marland Kitchen.. 1 by mouth two times a day for 10 days  Patient Instructions: 1)  drink lots of water and avoid sodas 2)  take the cipro as directed  3)  continue drinking lots of water 4)  call or seek care is symptoms don't improve in 2-3 days or if you develop back pain, nausea, or vomiting  5)  if no totally better in 10 days let me know  Prescriptions: CIPRO 500 MG TABS (CIPROFLOXACIN HCL) 1 by mouth two times a day for 10 days  #20 x 0   Entered and Authorized by:   Judith Part MD   Signed by:   Judith Part MD on 01/19/2010   Method used:   [facility]ron[facility] to        [facility] [phone][facility] (retail)       [address] [place], [place]  [address]       Ph: [phone] or [phone]       Fax: [phone]   RxID:  [id number]   Current Allergies (reviewed today): LIPITOR PAXIL PROZAC ASA CODEINE MORPHINE * TRAZADONE  Laboratory Results   Urine Tests  Date/Time Received: January 19, 2010 8:21 AM  Date/Time Reported: January 19, 2010 8:21 AM   Routine Urinalysis   Color: yellow Appearance: Clear Glucose: negative   (Normal Range: Negative) Bilirubin: negative   (Normal Range: Negative) Ketone: negative   (Normal Range: Negative) Spec. Gravity: >=1.030   (Normal Range: 1.003-1.035) Blood: small   (Normal Range: Negative) pH: 5.0   (Normal Range: 5.0-8.0) Protein: trace   (Normal Range: Negative) Urobilinogen: 0.2   (Normal Range: 0-1) Nitrite: negative   (Normal Range: Negative) Leukocyte Esterace: negative   (Normal Range: Negative)  Urine Microscopic WBC/HPF: 0-1 RBC/HPF: 1-3 Bacteria/HPF: few Mucous/HPF: few Epithelial/HPF: 1-2 Crystals/HPF: mod Casts/LPF: 0 Yeast/HPF: 0 Other: 0        Laboratory Results   Urine Tests    Routine Urinalysis   Color: yellow Appearance: Clear Glucose: negative   (Normal  Range: Negative) Bilirubin: negative   (Normal Range: Negative) Ketone: negative   (Normal Range: Negative) Spec. Gravity: >=1.030   (Normal Range: 1.003-1.035) Blood: small   (Normal Range: Negative) pH: 5.0   (Normal Range: 5.0-8.0) Protein: trace   (Normal Range: Negative) Urobilinogen: 0.2   (Normal Range: 0-1) Nitrite: negative   (Normal Range: Negative) Leukocyte Esterace: negative   (Normal Range: Negative)  Urine Microscopic WBC/hpf: 0-1 RBC/hpf: 1-3 Bacteria: few Mucous: few Epithelial: 1-2 Crystals/LPF: mod Casts/LPF: 0 Yeast/HPF: 0 Other: 0

## 2010-08-01 NOTE — Progress Notes (Signed)
Summary: lorazepam   Phone Note Refill Request Message from:  Fax from Pharmacy on April 27, 2010 9:43 AM  Refills Requested: Medication #1:  LORAZEPAM 2 MG TABS 1 by mouth three times a day   Last Refilled: 03/27/2010 [facility] [address] [phone].  Initial call taken by: Melody Comas,  April 27, 2010 9:44 AM  Follow-up for Phone Call        px written on EMR for call in  Follow-up by: Judith Part MD,  April 27, 2010 10:13 AM  Additional Follow-up for Phone Call Additional follow up Details #1::        Rx called to pharmacy Additional Follow-up by: Linde Gillis CMA Duncan Dull),  April 27, 2010 12:33 PM    New/Updated Medications: LORAZEPAM 2 MG TABS (LORAZEPAM) 1 by mouth three times a day Prescriptions: LORAZEPAM 2 MG TABS (LORAZEPAM) 1 by mouth three times a day  #90 x 3   Entered and Authorized by:   Judith Part MD   Signed by:   Linde Gillis CMA (AAMA) on 04/27/2010   Method used:   Telephoned to ...       [facility] [phone]* (retail)       [address] [place], [place]  [address]       Ph: [phone] or [phone]       Fax: [phone]   RxID:   [id number]

## 2010-08-01 NOTE — Assessment & Plan Note (Signed)
Summary: hospital f/u- [facility]/ alc   Vital Signs:  Patient profile:   65 year old male Height:      71 inches Weight:      221.25 pounds BMI:     30.97 Temp:     97.4 degrees F oral Pulse rate:   88 / minute Pulse rhythm:   regular BP sitting:   116 / 76  (left arm) Cuff size:   large  Vitals Entered By: Lewanda Rife LPN (March 02, 2010 9:03 AM) CC: f/u [facility] [place] Er visit 02/21/10 could not void. Did void while at ER ?kidney stone   History of Present Illness: had called here unable to urinate and with blood coming from tip of penis , and was in a lot of pain went to the ER in [place]  checked him in and went into ER then had urge to go and went with no problems while in the waiting room   wbc was 11 - per pt   he passed stone about 3 days later -- little stone (did not save it)  never had pain in back or flank  urine is clear today  put him on cipro for 10 days -- 3 more pill  they did not do any imaging studies  has been drinking cranberry juice and water  is back to normal now -- today is a good day   has lesion on R side of temple -- and it crusts up and then bleeds         Allergies: 1)  Lipitor 2)  Paxil 3)  Prozac 4)  Asa 5)  Codeine 6)  Morphine 7)  * Trazadone  Past History:  Past Medical History: Last updated: 01/19/2010 CAD (ICD-414.00) s/p CABG 2009 HYPERTENSION (ICD-401.9) HYPERLIPIDEMIA (ICD-272.4) BARRETTS ESOPHAGUS (ICD-530.85) COPD (ICD-496) SLEEP APNEA (ICD-780.57) INSOMNIA (ICD-780.52) VENTRAL HERNIA (ICD-553.20) IRRITABLE BOWEL SYNDROME (ICD-564.1) diverticulitis with surgery in the past  past hx of frequent uti and prostatitis before his colon surgery GERD (ICD-530.81) DEPRESSION (ICD-311) ANXIETY (ICD-300.00) ALLERGIC RHINITIS (ICD-477.9) Hx of ALCOHOL ABUSE (ICD-305.00) gastritis  Past Surgical History: Last updated: 01/06/2009 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  1995 Nissen fundoplication 1985 Colonoscopy- ok, spacticity of anastamosis  (05/1998) EGD Ventral hernia repair (03/2000, 10/2000) Gynecomastia- focal, surgery (03/2000) Rest/ stress cardiolite- neg (03/2001) Neck fusion 5/09 hosp MI-- cardiac cath with 3 vessel CAD CABG 5/09 7/10 EGD- barrett's esophagus, stricture, gastritis   Family History: Last updated: 12/06/2008 Father: heart disease, stroke- deceased Mother: OP Siblings: 1 sister- anxiety, back problems No FH of Colon Cancer: Family History of Heart Disease: Father, Uncles x 5 Family History of Diabetes: Paternal Uncle, Maternal Uncle  Social History: Last updated: 12/07/2008 Marital Status: Married Children: 3 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  Alcohol Use - yes-rare Daily Caffeine Use-1 cup daily Illicit Drug Use - no Patient gets regular exercise. Regular Exercise - yes  Risk Factors: Exercise: yes (12/07/2008)  Risk Factors: Smoking Status: never (12/06/2008)  Review of Systems General:  Denies fatigue, fever, loss of appetite, and malaise. Eyes:  Denies blurring and eye irritation. CV:  Denies chest pain or discomfort, palpitations, and shortness of breath with exertion. Resp:  Denies cough and wheezing. GI:  Denies abdominal pain and indigestion. GU:  Denies discharge, dysuria, hematuria, urinary frequency, and urinary hesitancy. MS:  Denies joint pain. Derm:  Complains of itching and lesion(s). Neuro:  Denies numbness and tingling. Heme:  Denies abnormal  bruising and bleeding.  Physical Exam  General:  overweight but generally well appearing  Head:  normocephalic, atraumatic, and no abnormalities observed.   Eyes:  vision grossly intact, pupils equal, pupils round, and pupils reactive to light.  no conjunctival pallor, injection or icterus  Mouth:  pharynx pink and moist.   Neck:  No deformities, masses, or tenderness noted. Lungs:  Normal respiratory effort, chest  expands symmetrically. Lungs are clear to auscultation, no crackles or wheezes. Heart:  Normal rate and regular rhythm. S1 and S2 normal without gallop, murmur, click, rub or other extra sounds. Abdomen:  no suprapubic tenderness or fullness felt  Msk:  no CVA tenderness  Extremities:  No clubbing, cyanosis, edema, or deformity noted with normal full range of motion of all joints.   Skin:  crusted 1/2 cm lesion R temple  Cervical Nodes:  No lymphadenopathy noted Psych:  normal affect, talkative and pleasant    Impression & Recommendations:  Problem # 1:  NEPHROLITHIASIS (ICD-592.0) Assessment New  with passage at home per pt  send for ER reports  ua today clear disc how to prevent future stones --and imp of not getting dehydrated  if symptoms return - adv to call   Orders: UA Dipstick W/ Micro (manual) (96295)  Problem # 2:  UTI (ICD-599.0) Assessment: New in conjunction with kidney stone that passed finish cipro from ER re check cbc  update if any recurrence of symptoms ua is clear today His updated medication list for this problem includes:    Cipro 500 Mg Tabs (Ciprofloxacin hcl) .Marland Kitchen... 1 by mouth two times a day for 10 days  Orders: Venipuncture (28413) TLB-CBC Platelet - w/Differential (85025-CBCD) UA Dipstick W/ Micro (manual) (24401)  Complete Medication List: 1)  Lorazepam 2 Mg Tabs (Lorazepam) .Marland Kitchen.. 1 by mouth three times a day 2)  Lotensin 20 Mg Tabs (Benazepril hcl) .... Take 1 tablet by mouth two times a day 3)  Pravachol 80 Mg Tabs (Pravastatin sodium) .Marland Kitchen.. 1 by mouth once daily 4)  Plavix 75 Mg Tabs (Clopidogrel bisulfate) .... Take 1 tablet by mouth once a day 5)  Metamucil 48.57 % Powd (Psyllium) .Marland Kitchen.. 1 scoop daily 6)  Benadryl 25 Mg Caps (Diphenhydramine hcl) .... Take 1 by mouth at bedtime 7)  Aleve 220 Mg Tabs (Naproxen sodium) .... As needed 8)  Multivitamins Tabs (Multiple vitamin) .... Take 1 by mouth once daily 9)  Metoprolol Succinate 25 Mg  Xr24h-tab (Metoprolol succinate) .... Take one half tablet by mouth daily 10)  Cialis 10 Mg Tabs (Tadalafil) .... Take one by mouth as needed prior to activity 11)  Cipro 500 Mg Tabs (Ciprofloxacin hcl) .Marland Kitchen.. 1 by mouth two times a day for 10 days  Other Orders: Dermatology Referral (Derma)  Patient Instructions: 1)  please send for ER records from [place] 2)  we will do dermatology referral at check out  3)  if kidney/ bladder/ urinary symptoms return - let me know  4)  finish the cipro  5)  we will check white blood cell count today   Current Allergies (reviewed today): LIPITOR PAXIL PROZAC ASA CODEINE MORPHINE * TRAZADONE  Laboratory Results   Urine Tests  Date/Time Received: March 02, 2010 9:05 AM  Date/Time Reported: March 02, 2010 9:05 AM   Routine Urinalysis   Color: straw Appearance: Clear Glucose: negative   (Normal Range: Negative) Bilirubin: negative   (Normal Range: Negative) Ketone: negative   (Normal Range: Negative) Spec. Gravity: 1.025   (  Normal Range: 1.003-1.035) Blood: negative   (Normal Range: Negative) pH: 5.0   (Normal Range: 5.0-8.0) Protein: negative   (Normal Range: Negative) Urobilinogen: 0.2   (Normal Range: 0-1) Nitrite: negative   (Normal Range: Negative) Leukocyte Esterace: negative   (Normal Range: Negative)  Urine Microscopic WBC/HPF: 0-1 RBC/HPF: 0 Bacteria/HPF: 0 Mucous/HPF: 0 Epithelial/HPF: 0-1 Crystals/HPF: 0 Casts/LPF: 0 Yeast/HPF: 0 Other: 0        Laboratory Results   Urine Tests    Routine Urinalysis   Color: straw Appearance: Clear Glucose: negative   (Normal Range: Negative) Bilirubin: negative   (Normal Range: Negative) Ketone: negative   (Normal Range: Negative) Spec. Gravity: 1.025   (Normal Range: 1.003-1.035) Blood: negative   (Normal Range: Negative) pH: 5.0   (Normal Range: 5.0-8.0) Protein: negative   (Normal Range: Negative) Urobilinogen: 0.2   (Normal Range: 0-1) Nitrite:  negative   (Normal Range: Negative) Leukocyte Esterace: negative   (Normal Range: Negative)  Urine Microscopic WBC/hpf: 0-1 RBC/hpf: 0 Bacteria: 0 Mucous: 0 Epithelial: 0-1 Crystals/LPF: 0 Casts/LPF: 0 Yeast/HPF: 0 Other: 0

## 2010-08-03 NOTE — Progress Notes (Signed)
Summary: BP  Phone Note Call from Patient Call back at Home Phone [phone]   Caller: Self Call For: Gollan Summary of Call: Pt is taking Effient and Vytorin.  BP has been running differently.  Bottom number is 90-105 and the top number is running 140. Initial call taken by: Harlon Flor,  June 14, 2010 3:57 PM  Follow-up for Phone Call        Spoke to pt, he states his diastolic BP has been running between 95-105 consistently for past 2 weeks. He has not changed any meds, pt will come in today for BP check and bring his monitor as well to check accuracy. Follow-up by: Lanny Hurst RN,  June 15, 2010 10:02 AM

## 2010-08-03 NOTE — Assessment & Plan Note (Signed)
Summary: BP Check  Nurse Visit   Vital Signs:  Patient profile:   65 year old male Height:      71 inches Weight:      218 pounds BMI:     30.51 Pulse rate:   56 / minute BP sitting:   150 / 98  (left arm) Cuff size:   regular CC: Elevated blood pressure Comments Pt in today for elevated BP for past 3 weeks. Pt states BP at home has been running 140s/90s-105. Reveiwed pt's medication list, no changes have been made. Manual BP checked and reads consistently with home device. Dr. Mariah Milling made aware, advised pt to start HCTZ 12.5mg  and to monitor BP at home and call with results after one week. If not at goal, may increase to 25mg . Pt instructed on how medication works and pt understands may increase urine output.  Pt will call sooner if any problems with medication and may replace with Amlodipine if this occurs per Dr. Mariah Milling.    Allergies: 1)  Lipitor 2)  Paxil 3)  Prozac 4)  Asa 5)  Codeine 6)  Morphine 7)  * Trazadone Prescriptions: HYDROCHLOROTHIAZIDE 25 MG TABS (HYDROCHLOROTHIAZIDE) Take 1/2 tablet once daily.  #30 x 6   Entered by:   Lanny Hurst RN   Authorized by:   Dossie Arbour MD   Signed by:   Lanny Hurst RN on 06/15/2010   Method used:   [facility]ron[facility] to        [facility] [phone]* (retail)       [address] [place], [place]  [address]       Ph: [phone] or [phone]       Fax: [phone]   RxID:   [id number]

## 2010-08-09 NOTE — Assessment & Plan Note (Signed)
Summary: f1y   Visit Type:  1 year follow up Referring Provider:  Roxy Manns, MD Primary Provider:  Roxy Manns, MD  CC:  High blood pressure- Chest tightness since started taking Vytorin and Effient.  History of Present Illness: 65 year-old male with CAD s/p CABG in 2009. He initially presented with NSTEMI. The patient presents for followup today. He has an Aspirin allergy and previously has been on plavix. This was changed to Effient severeal months ago but he has not tolerated this well. The patient complains of chest tightness and relates this to the medication. His symptoms are nonexertional and they are unlike those at the time of his NSTEMI. They occur daily and are nonprogressive. He notes symptoms started as soon as he made the medication change. He also changed his statin to Vytorin and complains of hand weakness and diffuse myalgias since starting this medication. He wishes to go back on plavix and his prior statin as he was tolerating these meds well.  Notes BP has been elevated at  home. No dyspnea, palps, or other complaints.  Current Medications (verified): 1)  Lorazepam 2 Mg Tabs (Lorazepam) .Marland Kitchen.. 1 By Mouth Three Times A Day 2)  Lotensin 20 Mg Tabs (Benazepril Hcl) .... Take 1 Tablet By Mouth Two Times A Day 3)  Vytorin 10-40 Mg Tabs (Ezetimibe-Simvastatin) .... Take One Tablet By Mouth Daily At Bedtime 4)  Metamucil 48.57 % Powd (Psyllium) .Marland Kitchen.. 1 Scoop Daily 5)  Benadryl 25 Mg Caps (Diphenhydramine Hcl) .... Take 1 By Mouth At Bedtime 6)  Aleve 220 Mg Tabs (Naproxen Sodium) .... As Needed 7)  Multivitamins  Tabs (Multiple Vitamin) .... Take 1 By Mouth Once Daily 8)  Metoprolol Succinate 25 Mg Xr24h-Tab (Metoprolol Succinate) .... Take One Half Tablet By Mouth Daily 9)  Effient 10 Mg Tabs (Prasugrel Hcl) .... Take One Tablet By Mouth Daily 10)  Hydrochlorothiazide 25 Mg Tabs (Hydrochlorothiazide) .... Take 1/2 Tablet Once Daily.  Allergies: 1)  Lipitor 2)  Paxil 3)   Prozac 4)  Asa 5)  Codeine 6)  Morphine 7)  * Trazadone  Past History:  Past medical history reviewed for relevance to current acute and chronic problems.  Past Medical History: Reviewed history from 01/19/2010 and no changes required. CAD (ICD-414.00) s/p CABG 2009 HYPERTENSION (ICD-401.9) HYPERLIPIDEMIA (ICD-272.4) BARRETTS ESOPHAGUS (ICD-530.85) COPD (ICD-496) SLEEP APNEA (ICD-780.57) INSOMNIA (ICD-780.52) VENTRAL HERNIA (ICD-553.20) IRRITABLE BOWEL SYNDROME (ICD-564.1) diverticulitis with surgery in the past  past hx of frequent uti and prostatitis before his colon surgery GERD (ICD-530.81) DEPRESSION (ICD-311) ANXIETY (ICD-300.00) ALLERGIC RHINITIS (ICD-477.9) Hx of ALCOHOL ABUSE (ICD-305.00) gastritis  Review of Systems       Negative except as per HPI   Vital Signs:  Patient profile:   65 year old male Height:      71 inches Weight:      215 pounds BMI:     30.09 Pulse rate:   83 / minute Pulse rhythm:   regular Resp:     18 per minute BP sitting:   147 / 101  (left arm) Cuff size:   large  Vitals Entered By: Vikki Ports (July 27, 2010 2:07 PM)  Physical Exam  General:  Pt is alert and oriented, in no acute distress. HEENT: normal Neck: normal carotid upstrokes without bruits, JVP normal Lungs: CTA CV: RRR without murmur or gallop Abd: soft, NT, positive BS, no bruit, no organomegaly Ext: no clubbing, cyanosis, or edema. peripheral pulses 2+ and equal Skin: warm and dry without  rash    EKG  Procedure date:  07/27/2010  Findings:      NSR 83 bpm, cannot rule out age-indeterminate inferior MI.  Impression & Recommendations:  Problem # 1:  CAD (ICD-414.00) Pt is now 3 years removed from CABG. Will change antiplatelet Rx back to plavix in setting of ASA and effient intolerance. I asked him to closely monitor his symptom of chest pain and if this does not resolve with the medication change he will need further evaluation. He is convinced  this is due to medication side effect. He will contact us and let us know if symptoms persist.   His updated medication list for this problem includes:    Lotensin 20 Mg Tabs (Benazepril hcl) .Marland Kitchen... Take 1 tablet by mouth two times a day    Metoprolol Succinate 25 Mg Xr24h-tab (Metoprolol succinate) .Marland Kitchen... Take one tablet by mouth daily    Plavix 75 Mg Tabs (Clopidogrel bisulfate) .Marland Kitchen... Take one tablet by mouth daily  Orders: EKG w/ Interpretation (93000)  Problem # 2:  HYPERTENSION (ICD-401.9) Poorly controlled. Increase metoprolol and HCTZ as below. Continue home BP monitoring.  His updated medication list for this problem includes:    Lotensin 20 Mg Tabs (Benazepril hcl) .Marland Kitchen... Take 1 tablet by mouth two times a day    Metoprolol Succinate 25 Mg Xr24h-tab (Metoprolol succinate) .Marland Kitchen... Take one tablet by mouth daily    Hydrochlorothiazide 25 Mg Tabs (Hydrochlorothiazide) .Marland Kitchen... Take one tablet by mouth daily.  BP today: 147/101 Prior BP: 150/98 (06/15/2010)  Labs Reviewed: K+: 4.4 (10/21/2008) Creat: : 1.3 (10/21/2008)   Chol: 205 (03/16/2010)   HDL: 37 (03/16/2010)   LDL: 91 (03/16/2010)   TG: 384 (03/16/2010)  Problem # 3:  HYPERLIPIDEMIA (ICD-272.4) Resume pravachol 80 mg daily as he is intolerant to Vytorin.   His updated medication list for this problem includes:    Pravastatin Sodium 80 Mg Tabs (Pravastatin sodium) .Marland Kitchen... Take one tablet by mouth daily at bedtime  CHOL: 205 (03/16/2010)   LDL: 91 (03/16/2010)   HDL: 37 (03/16/2010)   TG: 384 (03/16/2010)  Patient Instructions: 1)  Your physician has recommended you make the following change in your medication: STOP Vytorin, STOP Effient, START Pravastatin 80mg  at bedtime, START Plavix 75mg  once a day, INCREASE HCTZ 25mg  to one daily, INCREASE Metoprolol Succinate 25mg  to one daily 2)  Your physician wants you to follow-up in:  3 MONTHS.  You will receive a reminder letter in the mail two months in advance. If you don't receive a  letter, please call our office to schedule the follow-up appointment. 3)  Your physician has requested that you regularly monitor and record your blood pressure readings at home.  Please use the same machine at the same time of day to check your readings and record them to bring to your follow-up visit. Prescriptions: PLAVIX 75 MG TABS (CLOPIDOGREL BISULFATE) Take one tablet by mouth daily  #90 x 3   Entered by:   Julieta Gutting, RN, BSN   Authorized by:   Norva Karvonen, MD   Signed by:   Julieta Gutting, RN, BSN on 07/27/2010   Method used:   Electronically to        [facility]* (mail-order)       [address]       [place], [place]  [address]       Ph: [phone]       Fax: [phone]   RxID:   [id number] PRAVASTATIN  SODIUM 80 MG TABS (PRAVASTATIN SODIUM) Take one tablet by mouth daily at bedtime  #90 x 3   Entered by:   Julieta Gutting, RN, BSN   Authorized by:   Norva Karvonen, MD   Signed by:   Julieta Gutting, RN, BSN on 07/27/2010   Method used:   Electronically to        [facility]* (mail-order)       [address]       [place], [place]  [address]       Ph: [phone]       Fax: [phone]   RxID:   [id number] HYDROCHLOROTHIAZIDE 25 MG TABS (HYDROCHLOROTHIAZIDE) Take one tablet by mouth daily.  #90 x 3   Entered by:   Julieta Gutting, RN, BSN   Authorized by:   Norva Karvonen, MD   Signed by:   Julieta Gutting, RN, BSN on 07/27/2010   Method used:   Electronically to        [facility]* (mail-order)       [address]       [place], [place]  [address]       Ph: [phone]       Fax: [phone]   RxID:   [id number] METOPROLOL SUCCINATE 25 MG XR24H-TAB (METOPROLOL SUCCINATE) Take one tablet by mouth daily  #90 x 3   Entered by:   Julieta Gutting, RN, BSN   Authorized by:   Norva Karvonen, MD   Signed by:   Julieta Gutting, RN, BSN on 07/27/2010   Method used:   Electronically to         [facility]* (mail-order)       [address]       [place], [place]  [address]       Ph: [phone]       Fax: [phone]   RxID:   [id number] LOTENSIN 20 MG TABS (BENAZEPRIL HCL) Take 1 tablet by mouth two times a day  #180 x 3   Entered by:   Julieta Gutting, RN, BSN   Authorized by:   Norva Karvonen, MD   Signed by:   Julieta Gutting, RN, BSN on 07/27/2010   Method used:   Electronically to        [facility] [phone]* (retail)       [address] [place], [place]  [address]       Ph: [phone] or [phone]       Fax: [phone]   RxID:   [id number]

## 2010-08-30 ENCOUNTER — Telehealth: Payer: Self-pay | Admitting: Family Medicine

## 2010-09-07 NOTE — Progress Notes (Signed)
Summary: lorazepam  Phone Note Refill Request Message from:  Fax from Pharmacy on August 30, 2010 8:37 AM  Refills Requested: Medication #1:  LORAZEPAM 2 MG TABS 1 by mouth three times a day   Last Refilled: 07/29/2010 [facility] [address] [phone].  Initial call taken by: Melody Comas,  August 30, 2010 8:38 AM  Follow-up for Phone Call        px written on EMR for call in  Follow-up by: Judith Part MD,  August 30, 2010 1:39 PM  Additional Follow-up for Phone Call Additional follow up Details #1::        Medication phoned to [facility] as instructed. Lewanda Rife LPN  August 30, 2010 4:03 PM     Prescriptions: LORAZEPAM 2 MG TABS (LORAZEPAM) 1 by mouth three times a day  #90 x 3   Entered and Authorized by:   Judith Part MD   Signed by:   Judith Part MD on 08/30/2010   Method used:   Telephoned to ...       [facility] [phone][facility] (retail)       [address] [place], [place]  [address]       Ph: [phone] or [phone]       Fax: [phone]   RxID:   [id number]

## 2010-11-09 ENCOUNTER — Encounter: Payer: Self-pay | Admitting: Cardiovascular Disease

## 2010-11-14 NOTE — Assessment & Plan Note (Signed)
OFFICE VISIT   AKEEL, REFFNER A  DOB:  [birth year]                                        December 16, 2007  CHART #:  [id number]   Mr. Cryer returned for followup, status post coronary artery bypass graft  surgery x3 on 11/20/2007.  He has been feeling well overall and is  walking daily without chest pain or shortness of breath.  He has seen  Dr. Arnoldo Hooker in followup.   PHYSICAL EXAMINATION:  His blood pressure is 146/86 and his pulse is 47  and regular.  Respiratory rate is 20 and unlabored.  Oxygen saturation  on room air is 99%.  He looks well.  Cardiac exam shows a regular rate  and rhythm with normal heart sounds.  His lung exam is clear.  Chest  incision is healing well and the sternum is stable.  His right leg  incision has slight separation, but is healing well overall.  There is  no sign of infection.  There is no peripheral edema.   Followup chest x-ray shows clear lung fields and no pleural effusions.   MEDICATIONS:  1. Plavix 75 mg daily.  2. Lotensin 40 mg daily.  3. Pravastatin daily.  4. Lorazepam 2 mg t.i.d.  5. Benadryl at bedtime p.r.n.  6. Lopressor 25 mg b.i.d.   IMPRESSION:  Overall, Mr. Guiffre is recovering well following his surgery.  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  I told him he could  return to that whenever he feels up to it.  He will continue to follow  up with Dr. Arnoldo Hooker in [place] and will contact me if he  develops any problems with his incision.   Evelene Croon, M.D.  Electronically Signed   BB/MEDQ  D:  12/16/2007  T:  12/16/2007  Job:  [id number]   cc:   Arnoldo Hooker, MD

## 2010-11-14 NOTE — H&P (Signed)
NAME:  Seth David, Seth David NO.:  [id number]   MEDICAL RECORD NO.:  [id number]           PATIENT TYPE:   LOCATION:                                 FACILITY:   PHYSICIAN:  Theda Belfast, PA DATE OF BIRTH:  1945-08-30   DATE OF ADMISSION:  DATE OF DISCHARGE:                              HISTORY & PHYSICAL   REFERRING CARDIOLOGIST:  Dr. Gwen Pounds.   CHIEF. COMPLAINT:  Chest pain.   HISTORY OF PRESENT ILLNESS:  The patient is a pleasant 65 year old male  with a past medical history of hypertension, hyperlipidemia, and  irritable bowel disease.  The patient states over the past several  months he would have episodes of chest pain which he thought were reflux  which did not last long and resolved on their own.  He had undergone a  stress test, which he was told was negative.  The patient on Nov 17, 2007, developed significant chest pain in the morning 7/10.  He stated  he thought it was reflux and took some antacids.  Following taking the  antacids, reflux resolved.  He then states that he went to [facility] to  pick up some medications and blood pressure was taken there.  At this  time, the blood pressure was noted to be elevated and he went to the  emergency room.  The EKG done in the emergency room showed lateral T-  wave inversions consistent with myocardial infarction.  Cardiac enzymes  showed a troponin elevation of 42.8.  The patient was admitted to  [facility] at this time for diagnosis of an acute myocardial  infarction.  The patient was seen by Dr. Gwen Pounds in [place].  Dr.  Gwen Pounds discussed with the patient undergoing cardiac catheterization.  This was done on Nov 18, 2007.  Cardiac catheterization showed 3-vessel  coronary artery disease.  Following cardiac catheterization due to  diagnosis, it was felt that the patient required transfer to [facility] for discussion of undergoing coronary artery bypass grafting.   The patient presents  to [facility] with no symptoms of chest  pain, shortness of breath, nausea, vomiting, diaphoresis, orthopnea, and  paroxysmal nocturnal dyspnea.   PAST MEDICAL HISTORY:  1. Hypertension.  2. Hyperlipidemia.  3. Irritable bowel disease.  4. Anxiety.   PAST SURGICAL HISTORY:  1. Status post colectomy.  2. Status post cervical fusion, vertebrae C5 through C7.  3. Status post surgery for reflux according to the patient.   ALLERGIES:  The patient states allergy to CODEINE and MORPHINE.  All  narcotics causes elevated blood pressure.   MEDICATIONS:  1. Benazepril 20 mg daily.  2. Lorazepam 1 mg t.i.d.  3. Benadryl p.r.n. at night.   SOCIAL HISTORY:  The patient lives alone.  He has 3 children.  He is  currently retired.   FAMILY HISTORY:  Positive for father deceased at age 37 with a  cerebrovascular accident.  His multiple uncles have undergone heart  attack.  Mother deceased 8.  The patient states history of  osteoporosis.   REVIEW OF SYSTEMS:  See HPI for pertinent positives and negatives.  The  patient denies any fevers, chills, or night sweats.  Denies any recent  changes in vision, hearing, or difficulty swallowing.  Denies any  shortness of breath, hemoptysis, coughing, or wheezing.  Denies any  nausea, vomiting, abdominal pain, changes in bowel movements, diarrhea,  constipation, hematemesis, or hematochezia.  Denies any urgency,  frequency, dysuria, or hematuria.  Denies any muscle aches or pains.  Denies any claudication symptoms such as calf pain with ambulation,  numbness, tingling in the lower extremities, change in temperature of  feet.  Denies any TIA or CVA symptoms such as amaurosis fugax, slurred  speech, or muscle weakness.   PHYSICAL EXAM:  VITAL SIGNS:  Blood pressure 131/84, pulse 74, O2 sat is  98% on room air, and temperature of 97.1.  The patient's weight is 97.3  kg.  GENERAL:  Well-developed, well-nourished white male in no acute   distress.  RESPIRATORY:  Clear to auscultation bilaterally.  CARDIAC:  Regular rate and rhythm with S1 and S2 noted.  ABDOMEN:  Bowel sounds x4.  Soft and nontender on palpation.  GENITOURINARY:  Deferred.  RECTAL:  Deferred.  EXTREMITIES:  No edema, cyanosis, or clubbing noted.  The patient has 2+  bilateral DP, PT, and radial pulses noted.  NEUROLOGIC:  Cranial nerves II through XII intact.  The patient is alert  and oriented x4.   STUDIES:  The patient had cardiac catheterization on Nov 18, 2007,  showing an ostial LAD of 40% stenosis with the proximal LAD with tubular  60% stenosis.  The first diagonal had a 95% stenosis as well as 75%  stenosis.  Proximal circumflex showed a tubular 85% stenosis.  The first  obtuse marginal had an 80% stenosis.  The proximal ramus intermedius had  a 90% stenosis.  Proximal RCA had a 70% stenosis.  EF was 50%.   IMPRESSION AND PLAN:  The patient was seen with acute myocardial  infarction with 3-vessel coronary artery disease.  Plan to admit the  patient to [facility] under Dr. Laneta Simmers.  We will order routine  lab work and studies done.  We will potentially plan for surgery  tomorrow by Dr. Laneta Simmers for the patient to undergo coronary artery bypass  grafting.  The patient was seen and evaluated by Dr. Laneta Simmers.  Dr. Laneta Simmers  to discuss the risks and benefits with the patient in detail.  We will  plan to obtain duplex ultrasound as well as an echocardiogram if not  done prior.  The patient is in agreement.      Theda Belfast, PA     KMD/MEDQ  D:  11/19/2007  T:  11/20/2007  Job:  [id number]   cc:   Evelene Croon, M.D.

## 2010-11-14 NOTE — Op Note (Signed)
NAME:  Seth David, Seth David NO.:  [id number]   MEDICAL RECORD NO.:  [id number]          PATIENT TYPE:  INP   LOCATION:  2302                         FACILITY:  [facility]   PHYSICIAN:  Evelene Croon, M.D.     DATE OF BIRTH:  September 02, 1945   DATE OF PROCEDURE:  11/20/2007  DATE OF DISCHARGE:                               OPERATIVE REPORT   PREOPERATIVE DIAGNOSIS:  Severe multivessel coronary artery disease with  unstable angina.   POSTOPERATIVE DIAGNOSIS:  Severe multivessel coronary artery disease  with unstable angina.   OPERATIVE PROCEDURES:  Median sternotomy, extracorporeal circulation,  coronary artery bypass graft surgery x3 using a left internal mammary  artery graft, left anterior descending coronary artery, the saphenous  vein graft to the obtuse marginal branch of the left circumflex coronary  artery, and saphenous vein graft to the posterior descending branch of  the right coronary artery.  Endoscopic vein harvesting from the right  leg.   SURGEON:  Evelene Croon, MD   ASSISTANT:  Coral Ceo, PA   ANESTHESIA:  General endotracheal.   CLINICAL HISTORY:  This patient is a 65 year old gentleman with a  history of hypertension and hyperlipidemia as well as irritable bowel  syndrome who had no prior cardiac history, but reports 3-4 episodes of  brief substernal chest pain in the past that he felt were secondary to  reflux.  On Monday of this week, he developed substernal chest pain in  the morning, which lasted most of the day prompting admission to  [facility].  Electrocardiogram showed nonspecific  inferior changes.  His troponin was 42.8 and his CPK was 1567.  Cardiac  catheterization by Dr. Arnoldo Hooker showed severe three-vessel  coronary artery disease with a long hazy proximal LAD stenosis of 60%-  70%.  There is a high-grade stenosis of a small diagonal branch that did  not appear graftable.  The left circumflex had 85% proximal  stenosis  before a large obtuse marginal that had about 80% proximal stenosis in  it.  The right coronary artery was relatively small vessel with 70%  midvessel stenosis.  It had a single posterior descending branch.  The  left ventricular ejection fraction about 50%.  After review of the  angiogram and examination of the patient, it was felt that coronary  artery bypass graft surgery was the best treatment for further ischemia  and infarction.  I discussed the operative procedure with the patient  including alternatives, benefits, and risks including, but not limited  to bleeding, blood transfusion, infection, stroke, myocardial  infarction, graft failure, and death.  I also discussed the possibility  of using bilateral internal mammary grafts and radial artery grafts  depending on the patient's anatomy in the operating room.   OPERATIVE PROCEDURE:  The patient was taken to the operating room and  placed on table in supine position.  After induction of general  endotracheal anesthesia, a Foley catheter was placed in bladder using  sterile technique.  Then, the chest, abdomen, and both lower extremities  were prepped and draped in the usual  sterile manner.  Then, the chest  was opened through a median sternotomy incision, the pericardium of the  midline.  Examination of the heart showed good ventricular  contractility.  The ascending aorta had no palpable plaques in it.   Then, the left internal mammary artery was harvested from the chest wall  as a pedicle graft.  This was a medium caliber vessel with excellent  blood flow through it.  At the same time, a segment of greater saphenous  vein was harvested from the right leg using endoscopic vein harvest  technique.  This vein was of medium size and good quality.  I decided  not to use the left radial artery graft in this patient since the  pulsation of the wrist was not that strong.  I was concerned about the  size of the artery related  to the large size of the obtuse marginal  vessel.  Given his age, I did not feel it was worth the risk of using  this radial artery.  The right coronary artery was examined and there  was diffuse plaque throughout the right coronary artery and this  extended out into the proximal portion of posterior descending artery.  I did not feel that the right internal mammary graft would reach out to  the midportion of the posterior descending artery, and therefore, I  decided not to harvest that.   Then, the patient was heparinized when an adequate activated clotting  time was achieved.  The distal ascending aorta was cannulated using 20-  French aortic cannula for arterial inflow.  Venous outflow was achieved  using a two-stage venous cannula through the right atrial appendage.  An  antegrade cardioplegia and vent cannula was inserted in the aortic root.   The patient was placed on cardiopulmonary bypass and distal coronaries  identified.  The LAD was a large graftable vessel that was diffusely  diseased with plaque throughout its proximal and midportion superiorly.  Distally, there were a few focal areas of plaque.  The diagonal branch  was a small diffusely diseased non-graftable vessel.  There were several  other very small diagonal branches.  The left circumflex gave off a tiny  first marginal branch.  The second marginal was the large vessel that  was heavily diseased proximally, but the mid and distal portion did not  appear to have any disease.  The right coronary artery was diffusely  diseased as mentioned above this continued out into the proximal portion  of the posterior descending artery.  In the midportion of the vessel, it  was fairly soft and suitable for grafting.   An aorta was cross clamped and 600 mL of cold blood antegrade  cardioplegia was administered in the aortic root with quick arrest of  the heart.  Systemic hypothermia 223 centigrade and topical hypothermic  iced  saline was used.  A temperature probe was placed in septum  insulating pad in the pericardium.   The first distal anastomosis was performed to the obtuse marginal  branch.  The internal diameter of this vessel was about 2.5 mm.  The  conduit was used was a segment of greater saphenous vein anastomosis  performed in end-to-side manner using continuous 7-0 Prolene suture.  Flow was noted through the graft was excellent.   The second distal anastomosis was performed to the posterior descending  artery.  The internal diameter in the midportion was about 1.6 mm.  The  conduit used was a second segment of greater saphenous  vein and the  anastomosis formed a end-to-side manner using continuous 7-0 Prolene  suture.  Flow was noted through the graft was excellent.  Then another  dose of cardioplegia was given down vein grafts and aortic root.   Third distal anastomosis was performed to distal LAD.  The internal  diameter vessel was about 1.75 mm.  The conduit used was a left internal  mammary graft.  This brought through an opening left pericardium  anterior of the phrenic nerve.  It was anastomosed to the LAD in end-to-  side manner using continuous 8-0 Prolene suture.  The pedicle was  sutured to the epicardium 6-0 Prolene sutures.  Then, the patient  rewarmed to 37 degrees centigrade.  With the crossclamp in place, the  two proximal vein graft anastomoses were performed.  The aortic root in  end-to-side manner using continuous 8-0 Prolene suture.  The pedicle was  sutured to the epicardium with 6-0 Prolene sutures.  Then, the clamp was  removed from mammary pedicle.  There is rapid warming of the ventricular  septum and return of spontaneous ventricular fibrillation.  The  crossclamp was removed with time of 60 minutes.  There is spontaneous  return of sinus rhythm.   The proximal and distal anastomoses appeared hemostatic while the graft  was satisfactory.  Graft markers placed around the  proximal anastomoses.  Temporary right ventricular and right atrial pacing wires placed through  the skin.   The patient rewarmed to 37 degrees centigrade.  He was weaned from  cardiopulmonary bypass on no inotropic agents.  Total bypass time was 78  minutes.  Cardiac function appeared excellent with cardiac output of 5.5  liters a minute.  Protamine was given and the venous and aortic has been  removed without difficulty.  Hemostasis was achieved.  Three chest tubes  were placed with tube in the posterior pericardium, one in the left  pleural space and one in the anterior mediastinum.  The pericardium was  reapproximated over the heart.  The sternum was closed #6 stainless  steel wires.  Fascia was closed with continuous #1 Vicryl suture.  Subcutaneous tissue was closed with continuous 2-0 Vicryl and skin with  3-0 Vicryl subcuticular closure.  Lower extremity vein harvest site was  closed in layers in a similar manner.  Sponge, needles, and instrument  counts were correct according to scrub nurse.  Dry sterile dressing  applied over the incisions around the chest tubes with Pleur-Evac  suction.  The patient remained hemodynamically stable, transferred to  the SICU in guarded but stable condition.      Evelene Croon, M.D.  Electronically Signed     BB/MEDQ  D:  11/20/2007  T:  11/21/2007  Job:  [id number]   cc:   Dr. Arnoldo Hooker

## 2010-11-14 NOTE — Discharge Summary (Signed)
NAME:  Seth David, Seth David NO.:  [id number]   MEDICAL RECORD NO.:  [id number]          PATIENT TYPE:  INP   LOCATION:  2035                         FACILITY:  [facility]   PHYSICIAN:  Evelene Croon, M.D.     DATE OF BIRTH:  [birth year]   DATE OF ADMISSION:  11/19/2007  DATE OF DISCHARGE:  11/24/2007                               DISCHARGE SUMMARY   PRIMARY ADMITTING DIAGNOSIS:  Chest pain.   ADDITIONAL/DISCHARGE DIAGNOSES:  1. Coronary artery disease.  2. Recent myocardial infarction.  3. Hypertension.  4. Hyperlipidemia.  5. Irritable bowel disease.  6. Anxiety.   PROCEDURES PERFORMED:  1. Coronary artery bypass grafting x3 (left internal mammary artery to      the LAD, saphenous vein graft to the PDA, saphenous vein graft to      the obtuse marginal).  2. Endoscopic vein harvest, right thigh.   HISTORY:  The patient is a 65 year old male who has had episodic chest  pain over the past several months, which he had attributed to reflux.  On Nov 17, 2007, he developed significant chest pain in the morning  which was unrelieved by antacids.  He had his blood pressure checked at  pharmacy and was noted to be elevated and he subsequently presented to  the Emergency Department at [facility].  An EKG there showed  lateral T-wave inversions consistent with myocardial infarction.  Cardiac enzymes were elevated as well as troponin I.  He was  subsequently admitted and seen by Dr. Gwen Pounds.  He underwent cardiac  catheterization on Nov 18, 2007, which showed severe three-vessel  coronary artery disease.  He was not felt to be a good candidate for  percutaneous intervention and therefore was transferred to [facility] under the care of Dr. Evelene Croon for consideration of  surgical revascularization.   HOSPITAL COURSE:  Seth David was admitted to [facility] Nov 19, 2007.  He was stable and pain free and his catheterization films were  reviewed by Dr.  Laneta Simmers who felt that he would benefit from CABG at this  time.  He explained the risks, benefits and alternatives of surgery to  the patient and he agreed to proceed.  He was taken to the operating  room on Nov 20, 2007, and underwent CABG x3 as described in detail  above.  He tolerated the procedure well and was transferred to the SICU  in stable condition.  He was able to be extubated shortly after surgery.  He was hemodynamically stable and doing well on postop day #1.  At that  time, his chest tubes and hemodynamic monitoring devices were removed  and he was able to be transferred to the floor.  Postoperatively, his  course was uneventful.  He has been somewhat hypertensive, was restarted  on his home beta blocker and ACE inhibitor dose and these have been  titrated upward accordingly.  He has been diuresed back down to his  preoperative weight.  He is ambulating in the halls without problem.  He  has remained afebrile and his vital  signs have been stable.  He is  tolerating a regular diet and having normal bowel and bladder function.  His incisions are healing well.   LABORATORY DATA:  His most recent labs show hemoglobin 13.9, hematocrit  39.3, platelets 302, white count 10.3, sodium 136, potassium 3.7, BUN  16, and creatinine 1.19.  He has been evaluated on morning rounds on Nov 24, 2007, and is felt stable for discharge home at this time.   DISCHARGE MEDICATIONS:  Are as follows,  1. Lotensin 40 mg daily.  2. Ultram 50-100 mg q. 4-6 h p.r.n. for pain.  3. Lopressor 25 mg b.i.d.  4. Plavix 75 mg daily.  5. Lorazepam 1 mg t.i.d.  6. Benadryl 12.5 mg nightly p.r.n.   DISCHARGE INSTRUCTIONS:  He is asked to refrain from driving, heavy  lifting, or strenuous activity.  He may continue ambulating daily and  using his incentive spirometer.  He may shower daily and clean his  incisions with soap and water.  He will continue low-fat, low-sodium  diet.   DISCHARGE FOLLOWUP:  He  will see Dr. Laneta Simmers back in the office in 3  weeks with a chest x-ray.  He is asked to make an appointment to see  Gwen Pounds in 2 weeks.  In the interim, if he experiences any problems or  has questions, he is asked to contact our office.      Coral Ceo, P.A.      Evelene Croon, M.D.  Electronically Signed    GC/MEDQ  D:  11/24/2007  T:  11/24/2007  Job:  [id number]   cc:   Dr. Gwen Pounds

## 2010-11-14 NOTE — Assessment & Plan Note (Signed)
[facility] OFFICE NOTE   NAME:Macquarrie, ELMUS MATHES                         MRN:          [id number]  DATE:08/31/2008                            DOB:          03-18-1946    REASON FOR VISIT:  Establish care for coronary artery disease and  previous MI.   HISTORY OF PRESENT ILLNESS:  Mr. Seth David is a very nice 65 year old  gentleman who sustained a non-ST-elevation MI in May 2009.  He initially  presented with substernal chest pain to [facility].  He underwent diagnostic catheterization by Dr. Amador Cunas that  demonstrated severe multivessel coronary artery disease, and he was  ultimately transferred to [facility] for multivessel coronary bypass  surgery.  He underwent grafting with a LIMA to the LAD, saphenous vein  graft to OM1, and saphenous vein graft to PDA.  He has done very well  since his bypass.  He has had some numbness over the left side of the  chest related to his sternotomy.  However, this has resolved over the  last month and he currently feels well with no symptoms at present.  He  walks for approximately 1 mile per day at a brisk pace.  At times, he  mixes in jogging with this.  He denies exertional chest pain or  pressure.  He has no dyspnea, orthopnea, PND, edema, or leg pain with  activity.  He denies lightheadedness, palpitations, or syncope.  He has  tolerated his medical therapy fairly well, with the exception of statin  intolerance.  He has had problems with more potent statins, but he has  been placed on pravastatin and has had no myalgias with that medication.  He reports good blood pressure control as an outpatient.  He has not  required any nitroglycerin over the last several months.   Current medications include:  1. Plavix 75 mg daily.  2. Pravastatin 40 mg daily.  3. Metoprolol 25 mg twice daily.  4. Benazepril 20 mg twice daily.  5. Lorazepam 2 mg t.i.d.   Allergies include ASPIRIN, MORPHINE, and CODEINE.   PAST MEDICAL HISTORY:  1. CAD status post CABG as outlined above.  2. Essential hypertension.  3. Hyperlipidemia.  4. Irritable bowel syndrome.  5. Gastroesophageal reflux disease status post open surgery in 1983,      suspect fundoplication.  6. Ruptured disk in the neck in 1992 with surgical treatment.  7. Partial colectomy for bleeding polyps in 1996.  8. COPD.   FAMILY HISTORY:  There is no history of coronary artery disease in any  immediate family members.  His father died at 63 of a stroke.  Mother  died at age 74.   SOCIAL HISTORY:  The patient is married.  He has 3 children.  He is  retired from office work.  He does not smoke cigarettes or drink  alcohol.  He drinks occasional caffeine.   REVIEW OF SYSTEMS:  Complete 12-point review of systems was performed.  Other than symptoms related to his irritable bowel syndrome with  diarrhea/constipation, he  has no symptoms to report.   PHYSICAL EXAMINATION:  GENERAL:  The patient is alert and oriented.  He  is in no acute distress.  VITAL SIGNS:  Weight is 227 pounds, blood pressure is 152/82, heart rate  is 44, respiratory rate 16.  HEENT:  Normal.  NECK:  Normal carotid upstrokes.  No bruits.  JVP normal.  No  thyromegaly or thyroid nodules.  LUNGS:  Clear bilaterally.  HEART:  The apex is discrete and nondisplaced.  The heart is regular and  bradycardic.  There are no murmurs or gallops.  ABDOMEN:  Soft, nontender, positive bowel sounds.  No organomegaly.  No  abdominal bruits.  BACK:  No CVA tenderness.  EXTREMITIES:  No clubbing, cyanosis, or edema.  Peripheral pulses are 2+  and equal throughout.  SKIN:  Warm and dry without rash.  LYMPHATICS:  No adenopathy.  NEUROLOGIC:  Cranial nerves II through XII are intact.  Strength is  intact and equal bilaterally.   EKG shows marked sinus bradycardia with first-degree AV block, otherwise  within normal limits.    ASSESSMENT:  1. Coronary artery disease status post coronary artery bypass graft.      The patient has no angina.  He is bradycardic, but he appears to be      asymptomatic at present.  We will continue his current medical      program without changes.  He is on an ACE inhibitor, beta-blocker,      and statin.  He seems to be tolerating the antiplatelet therapy      with Plavix in the setting of his aspirin allergy.  I encouraged      him to continue his regular exercise program.  2. Dyslipidemia.  Lipids were reviewed from his most recent blood work      dated January 29, 2008, which showed cholesterol 175, triglycerides      199, HDL 27, LDL 108.  We will repeat a lipid and liver panel since      he has been on pravastatin.  3. Hypertension.  Blood pressure is elevated today.  He reports good      control at home.  I would like him to stay on his same medications      and monitor his blood pressure.  He will call if readings are      greater than 140/90 as an outpatient.   For followup, I would like to see Mr. Rowser back in the office in 6  months.  I would be happy to see him sooner if any problems arise.    Veverly Fells. Excell Seltzer, MD    MDC/MedQ  DD: 08/31/2008  DT: 09/01/2008  Job #: [id number]   cc:   Marne A. Milinda Antis, MD

## 2010-11-15 ENCOUNTER — Ambulatory Visit (INDEPENDENT_AMBULATORY_CARE_PROVIDER_SITE_OTHER): Payer: Medicare Other | Admitting: Cardiovascular Disease

## 2010-11-15 ENCOUNTER — Encounter: Payer: Self-pay | Admitting: Cardiovascular Disease

## 2010-11-15 DIAGNOSIS — I25810 Atherosclerosis of coronary artery bypass graft(s) without angina pectoris: Secondary | ICD-10-CM

## 2010-11-15 DIAGNOSIS — E785 Hyperlipidemia, unspecified: Secondary | ICD-10-CM

## 2010-11-15 DIAGNOSIS — I2510 Atherosclerotic heart disease of native coronary artery without angina pectoris: Secondary | ICD-10-CM

## 2010-11-15 DIAGNOSIS — E7800 Pure hypercholesterolemia, unspecified: Secondary | ICD-10-CM

## 2010-11-15 DIAGNOSIS — I10 Essential (primary) hypertension: Secondary | ICD-10-CM

## 2010-11-15 NOTE — Assessment & Plan Note (Signed)
Blood pressure well-controlled on combination of benazepril and metoprolol.

## 2010-11-15 NOTE — Progress Notes (Signed)
HPI:  This is a 65 year old gentleman with coronary artery disease status post coronary bypass surgery in 2009. The patient presents for followup evaluation today. He denies chest pain, chest pressure, dyspnea, or edema. He has no palpitations, lightheadedness, or syncope. His main complaints are generalized fatigue and poor appetite.  He has lost 15 pounds since his last visit here in January. He denies fevers, chills, or night sweats. He does complain of a nonproductive cough.  He denies joint pain or swelling. He denies myalgias.  Outpatient Encounter Prescriptions as of 11/15/2010  Medication Sig Dispense Refill  . benazepril (LOTENSIN) 20 MG tablet Take 20 mg by mouth 2 (two) times daily.        . clopidogrel (PLAVIX) 75 MG tablet Take 75 mg by mouth daily.        . diphenhydrAMINE (BENADRYL) 25 mg capsule Take 25 mg by mouth every 6 (six) hours as needed.        . hydrochlorothiazide 25 MG tablet Take 25 mg by mouth daily.        Marland Kitchen LORazepam (ATIVAN) 2 MG tablet Take 2 mg by mouth every 8 (eight) hours as needed.        . metoprolol succinate (TOPROL-XL) 25 MG 24 hr tablet Take 25 mg by mouth daily.        . Multiple Vitamin (MULTIVITAMIN) tablet Take 1 tablet by mouth daily.        . Naproxen Sodium (ALEVE) 220 MG CAPS Take by mouth as needed.        . pravastatin (PRAVACHOL) 80 MG tablet Take 80 mg by mouth daily.        . Psyllium (METAMUCIL) 48.57 % POWD Take 1 scoop by mouth daily.          Allergies  Allergen Reactions  . Aspirin     REACTION: ears ring  . Atorvastatin     REACTION: muscle pain  . Codeine     REACTION: nervous  . Fluoxetine Hcl     REACTION: intolerant  . Morphine     REACTION: nervous  . Paroxetine     REACTION: sleepy    Past Medical History  Diagnosis Date  . Coronary artery disease   . Hypertension   . Hyperlipidemia   . Barrett's esophagus   . COPD (chronic obstructive pulmonary disease)   . Sleep apnea   . Insomnia   . Ventral hernia   . IBS  (irritable bowel syndrome)   . GERD (gastroesophageal reflux disease)   . Depression   . Anxiety   . Allergic rhinitis   . Gastritis     ROS: Negative except as per HPI  BP 128/88  Pulse 72  Resp 18  Ht 5\' 10"  (1.778 m)  Wt 200 lb 1.9 oz (90.774 kg)  BMI 28.71 kg/m2  PHYSICAL EXAM: Pt is alert and oriented, NAD HEENT: normal Neck: JVP - normal, carotids 2+= without bruits Lungs: CTA bilaterally CV: RRR without murmur or gallop Abd: soft, NT, Positive BS, no hepatomegaly Ext: no C/C/E, distal pulses intact and equal Skin: warm/dry no rash  ASSESSMENT AND PLAN:

## 2010-11-15 NOTE — Patient Instructions (Signed)
Your physician wants you to follow-up in: 6 MONTHS. You will receive a reminder letter in the mail two months in advance. If you don't receive a letter, please call our office to schedule the follow-up appointment.  Your physician recommends that you continue on your current medications as directed. Please refer to the Current Medication list given to you today.  Your physician recommends that you return for a FASTING lipid profile and liver profile at the end of the month. Nothing to eat or drink after midnight.

## 2010-11-15 NOTE — Assessment & Plan Note (Signed)
The patient is stable without angina. He is status post multivessel coronary bypass surgery approximately 3 years ago. He is aspirin intolerant and will be continued on Plavix.

## 2010-11-15 NOTE — Assessment & Plan Note (Signed)
The patient was intolerant to Vytorin. He is tolerating pravastatin 80 mg. He has upcoming lab work with Dr. Milinda Antis and we will add lipids and LFTs.

## 2010-11-25 ENCOUNTER — Encounter: Payer: Self-pay | Admitting: Family Medicine

## 2010-11-30 ENCOUNTER — Telehealth: Payer: Self-pay | Admitting: Family Medicine

## 2010-11-30 ENCOUNTER — Other Ambulatory Visit (INDEPENDENT_AMBULATORY_CARE_PROVIDER_SITE_OTHER): Payer: Medicare Other

## 2010-11-30 DIAGNOSIS — I25810 Atherosclerosis of coronary artery bypass graft(s) without angina pectoris: Secondary | ICD-10-CM

## 2010-11-30 DIAGNOSIS — I10 Essential (primary) hypertension: Secondary | ICD-10-CM

## 2010-11-30 DIAGNOSIS — E785 Hyperlipidemia, unspecified: Secondary | ICD-10-CM

## 2010-11-30 DIAGNOSIS — N410 Acute prostatitis: Secondary | ICD-10-CM

## 2010-11-30 DIAGNOSIS — K219 Gastro-esophageal reflux disease without esophagitis: Secondary | ICD-10-CM

## 2010-11-30 DIAGNOSIS — Z125 Encounter for screening for malignant neoplasm of prostate: Secondary | ICD-10-CM

## 2010-11-30 DIAGNOSIS — E7800 Pure hypercholesterolemia, unspecified: Secondary | ICD-10-CM

## 2010-11-30 LAB — HEPATIC FUNCTION PANEL
ALT: 29 U/L (ref 0–53)
Albumin: 4.1 g/dL (ref 3.5–5.2)
Total Protein: 7 g/dL (ref 6.0–8.3)

## 2010-11-30 LAB — CBC WITH DIFFERENTIAL/PLATELET
Basophils Relative: 0.8 % (ref 0.0–3.0)
Eosinophils Absolute: 0.1 10*3/uL (ref 0.0–0.7)
Eosinophils Relative: 1.7 % (ref 0.0–5.0)
Lymphocytes Relative: 32.3 % (ref 12.0–46.0)
MCHC: 34 g/dL (ref 30.0–36.0)
Monocytes Relative: 8.3 % (ref 3.0–12.0)
Neutrophils Relative %: 56.9 % (ref 43.0–77.0)
RBC: 5.94 Mil/uL — ABNORMAL HIGH (ref 4.22–5.81)
WBC: 8 10*3/uL (ref 4.5–10.5)

## 2010-11-30 LAB — COMPREHENSIVE METABOLIC PANEL
AST: 28 U/L (ref 0–37)
Albumin: 4.1 g/dL (ref 3.5–5.2)
BUN: 18 mg/dL (ref 6–23)
CO2: 31 mEq/L (ref 19–32)
Calcium: 9.4 mg/dL (ref 8.4–10.5)
Chloride: 94 mEq/L — ABNORMAL LOW (ref 96–112)
Potassium: 3.9 mEq/L (ref 3.5–5.1)

## 2010-11-30 LAB — LIPID PANEL
HDL: 42.3 mg/dL (ref >39.00)
Triglycerides: 176 mg/dL — ABNORMAL HIGH (ref 0.0–149.0)
VLDL: 35.2 mg/dL (ref 0.0–40.0)

## 2010-11-30 LAB — LDL CHOLESTEROL, DIRECT: Direct LDL: 157.6 mg/dL

## 2010-11-30 NOTE — Telephone Encounter (Signed)
Message copied by Judy Pimple on Thu Nov 30, 2010  9:08 AM ------      Message from: Baldomero Lamy      Created: Wed Nov 29, 2010  1:15 PM      Regarding: Cpx labs tomorrow       Please order  future cpx labs for pt's upcomming lab appt.      Thanks      Reginal Lutes Dr Excell Seltzer already ordered lipid and liver tests.

## 2010-12-04 ENCOUNTER — Encounter: Payer: Self-pay | Admitting: Family Medicine

## 2010-12-04 ENCOUNTER — Ambulatory Visit (INDEPENDENT_AMBULATORY_CARE_PROVIDER_SITE_OTHER): Payer: Medicare Other | Admitting: Family Medicine

## 2010-12-04 ENCOUNTER — Ambulatory Visit (INDEPENDENT_AMBULATORY_CARE_PROVIDER_SITE_OTHER)
Admission: RE | Admit: 2010-12-04 | Discharge: 2010-12-04 | Disposition: A | Discharge status: Home / Self Care | Payer: Medicare Other | Source: Ambulatory Visit | Attending: Family Medicine | Admitting: Family Medicine

## 2010-12-04 DIAGNOSIS — R05 Cough: Secondary | ICD-10-CM

## 2010-12-04 DIAGNOSIS — I10 Essential (primary) hypertension: Secondary | ICD-10-CM

## 2010-12-04 DIAGNOSIS — R634 Abnormal weight loss: Secondary | ICD-10-CM

## 2010-12-04 DIAGNOSIS — Z125 Encounter for screening for malignant neoplasm of prostate: Secondary | ICD-10-CM

## 2010-12-04 DIAGNOSIS — R059 Cough, unspecified: Secondary | ICD-10-CM | POA: Insufficient documentation

## 2010-12-04 DIAGNOSIS — E785 Hyperlipidemia, unspecified: Secondary | ICD-10-CM

## 2010-12-04 MED ORDER — CLOPIDOGREL BISULFATE 75 MG PO TABS: 75.0000 mg | ORAL_TABLET | Freq: Every day | ORAL | Status: AC

## 2010-12-04 MED ORDER — SPIRONOLACTONE 25 MG PO TABS: 25.0000 mg | ORAL_TABLET | Freq: Every day | ORAL | Status: AC

## 2010-12-04 MED ORDER — MIRTAZAPINE 15 MG PO TBDP: 15.0000 mg | ORAL_TABLET | Freq: Every day | ORAL | Status: AC

## 2010-12-04 NOTE — Assessment & Plan Note (Signed)
Pt did not tol vytorin so back on pravastatin  Followed by Dr Excell Seltzer LDL in 150s Disc low sat fat diet

## 2010-12-04 NOTE — Progress Notes (Signed)
Subjective:    Patient ID: Seth David, male    DOB: [birth year], 65 y.o.   MRN: [id number]  HPI Here for check up of chronic health problems and to review chronic med problems  Not feeling very good - is tired all the time- does not feel like doing anything and loosing his appetite  Not depressed  All started when he went to see Dr Excell Seltzer -- in [place] -- (had to see a new doctor) Changed from plavix to effient  Also changed from pravastatin to vytorin  Then his bp went very high  He went back and then put on hctz 1/2 tab per day -- really did not help  Went back to Dr Excell Seltzer -- in [place]- took him off the new meds and put him on what he was on before and inc hct to whole pill bp got better --120/75 average  Then more weak and tired   Wt is up 2 lb with bmi of 29  (from visit with dr Excell Seltzer)  Last time he saw me - was 225- now 202 -- lack of appetitie  occ sob - was reassured by Dr Excell Seltzer (also cp if he lies on his back )- struggles to get up at time  Pt states he is not depressed or stressed or sad Sleep is fluctuating  Appetite poor Motivation poor  Denies anxiety   Last chest x ray was at urgent care 3-4 months ago -- told a little fluid on lungs -- dry cough (still has) This needs to be re checked Ace cough is possible  No wheeze No prod  No fever  Zoster status -never had shingles or vaccine Wants vaccine - but needs to check with his ins   Pneumovax- is a good candidate for   Td in 07  colonosc 07 Had a foot of colon removed because of polyps  Next due --5 years was told   HTN bp is 132/90 today- fairly stable No ha or cp or palp   Lipids Lab Results  Component Value Date   CHOL 201* 11/30/2010   CHOL 205* 03/16/2010   CHOL 137 12/10/2008   Lab Results  Component Value Date   HDL 42.30 11/30/2010   HDL 37* 03/16/2010   HDL 29.60* 12/10/2008   Lab Results  Component Value Date   LDLCALC 91 03/16/2010   LDLCALC 80 12/10/2008   Lab Results  Component  Value Date   TRIG 176.0* 11/30/2010   TRIG 384* 03/16/2010   TRIG 135.0 12/10/2008   Lab Results  Component Value Date   CHOLHDL 5 11/30/2010   CHOLHDL 5.5 Ratio 03/16/2010   CHOLHDL 5 12/10/2008   Lab Results  Component Value Date   LDLDIRECT 157.6 11/30/2010   LDLDIRECT 120.3 10/21/2008   LDL is up to 150s  Is on pravachol  Non tol to lipitor   Sodium 133 Does take hctz   No prostate problems at all  No flow problems -- especially since starting diuretic  Nocturia just one time   Patient Active Problem List  Diagnoses  . NEOPLASM OF UNCERTAIN BEHAVIOR OF SKIN  . HYPERLIPIDEMIA  . ANXIETY  . ALCOHOL ABUSE  . DEPRESSION  . HYPERTENSION  . CAD  . ALLERGIC RHINITIS  . COPD  . GERD  . BARRETTS ESOPHAGUS  . VENTRAL HERNIA  . IRRITABLE BOWEL SYNDROME  . NEPHROLITHIASIS  . UTI  . PROSTATITIS, ACUTE  . INSOMNIA  . SLEEP APNEA  . Prostate cancer screening  .  Cough  . Weight loss   Past Medical History  Diagnosis Date  . Coronary artery disease   . Hypertension   . Hyperlipidemia   . Barrett's esophagus   . COPD (chronic obstructive pulmonary disease)   . Sleep apnea   . Insomnia   . Ventral hernia   . IBS (irritable bowel syndrome)   . GERD (gastroesophageal reflux disease)   . Depression   . Anxiety   . Allergic rhinitis   . Gastritis   . H/O alcohol abuse    Past Surgical History  Procedure Date  . Coronary artery bypass graft 2009  . Colectomy 1995    sigmoid for diverticulitis  . 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 Nissen fundoplication 1985  . Hernia repair 03/2000- 10/2000    ventral  . Gynecomastia excision 03/2000    focal surgery  . Neck surgery     fusion  . Esophagogastroduodenoscopy     Barretts esophagus,stricture,gastritis   History  Substance Use Topics  . Smoking status: Never Smoker   . Smokeless tobacco: Never Used  . Alcohol Use: Yes     rare   Family History  Problem Relation Age of Onset  . Heart  disease Father     deceased  . Stroke Father   . Other Mother     OP  . Anxiety disorder Sister   . Heart disease Other     uncles x 5  . Diabetes Other     paternal -maternal uncle   Allergies  Allergen Reactions  . Aspirin     REACTION: ears ring  . Atorvastatin     REACTION: muscle pain  . Codeine     REACTION: nervous  . Fluoxetine Hcl     REACTION: intolerant  . Hctz (Hydrochlorothiazide)     Malaise   . Morphine     REACTION: nervous  . Paroxetine     REACTION: sleepy  . Vytorin     malaise   Current Outpatient Prescriptions on File Prior to Visit  Medication Sig Dispense Refill  . benazepril (LOTENSIN) 20 MG tablet Take 20 mg by mouth 2 (two) times daily.        . clopidogrel (PLAVIX) 75 MG tablet Take 1 tablet (75 mg total) by mouth daily. GENERIC PLEASE  30 tablet  11  . diphenhydrAMINE (BENADRYL) 25 mg capsule Take 25 mg by mouth at bedtime.       Marland Kitchen LORazepam (ATIVAN) 2 MG tablet Take 2 mg by mouth 3 (three) times daily.       . metoprolol succinate (TOPROL-XL) 25 MG 24 hr tablet Take 25 mg by mouth daily.        . Multiple Vitamin (MULTIVITAMIN) tablet Take 1 tablet by mouth daily.        . Naproxen Sodium (ALEVE) 220 MG CAPS Take by mouth as needed.        . pravastatin (PRAVACHOL) 80 MG tablet Take 80 mg by mouth at bedtime.       . Psyllium (METAMUCIL) 48.57 % POWD Take 1 scoop by mouth daily.             Review of Systems Review of Systems  Constitutional: Negative for fever, appetite change,unexpected weight change. pos for fatigue  Eyes: Negative for pain and visual disturbance.  Respiratorypos for cough and pos for chronic sob/ no wheeze   Cardiovascular: Negative for cp or palp .   Gastrointestinal: Negative for  nausea, diarrhea and constipation.  Genitourinary: Negative for urgency and frequency.  Skin: Negative for pallor.  Neurological: Negative for weakness, light-headedness, numbness and headaches.  Hematological: Negative for  adenopathy. Does not bruise/bleed easily.  Psychiatric/Behavioral: Negative for dysphoric mood. The patient is not nervous/anxious.  -- but is tired with lack of motivation        Objective:   Physical Exam  Constitutional: He appears well-developed and well-nourished. No distress.       overwt and well appearing   HENT:  Head: Normocephalic and atraumatic.  Right Ear: External ear normal.  Left Ear: External ear normal.  Nose: Nose normal.  Mouth/Throat: Oropharynx is clear and moist.       Scant cerumen  Eyes: Conjunctivae and EOM are normal. Pupils are equal, round, and reactive to light. Right eye exhibits no discharge. Left eye exhibits no discharge.  Neck: Normal range of motion. Neck supple. No JVD present. Carotid bruit is not present. No thyromegaly present.  Cardiovascular: Normal rate, regular rhythm, normal heart sounds and intact distal pulses.   Pulmonary/Chest: Effort normal and breath sounds normal. No respiratory distress. He has no wheezes. He exhibits no tenderness.  Abdominal: Soft. Bowel sounds are normal. He exhibits no distension, no abdominal bruit and no mass. There is no tenderness.  Musculoskeletal: Normal range of motion. He exhibits no edema and no tenderness.  Lymphadenopathy:    He has no cervical adenopathy.  Neurological: He is alert. No cranial nerve deficit. Coordination and gait normal.  Skin: Skin is warm and dry. No rash noted. No erythema. No pallor.  Psychiatric: Thought content normal.       Seems generally listless and down  Not tearful Fair eye contact Good comm skills           Assessment & Plan:

## 2010-12-04 NOTE — Assessment & Plan Note (Signed)
This is mild- probably ace cough  Pt had ? cxr at [facility] showing ? Fluid Re check this - in light of wt loss today and update

## 2010-12-04 NOTE — Assessment & Plan Note (Signed)
Pt not tol hctz -- ? Fatigue - / unsure Also low na Change to spironolactone 25- very carefully watching K level in light of ace F/u 2-3 week for visit and labs

## 2010-12-04 NOTE — Patient Instructions (Addendum)
Chest x ray today  If you are interested in shingles vaccine in future - call your insurance company to see how coverage is and call us to schedule No pneumovax today- since you had reaction to other vaccine in past Stop the hctz Take spironolactone instead (a different fluid pill) Follow up in 2-3 weeks  Start remeron for appetite  Please send for last colonoscopy report from [facility]

## 2010-12-04 NOTE — Assessment & Plan Note (Signed)
Pt has no symptoms  Will do DRE at next visit due to multiple issues today

## 2010-12-04 NOTE — Assessment & Plan Note (Signed)
With some fatigue and lethargy and lack of appetite Pt denies depression Check cxr Change hctz  Trial of remeron for appetite- f/u 2-3 wk Disc poss side eff incl dep or sleepiness  ? Consider check for sleep apnea in future

## 2010-12-05 ENCOUNTER — Telehealth: Payer: Self-pay

## 2010-12-05 NOTE — Telephone Encounter (Signed)
Left vm for pt to callback 

## 2010-12-05 NOTE — Telephone Encounter (Signed)
Message copied by Patience Musca on Tue Dec 05, 2010  5:56 PM ------      Message from: Roxy Manns A      Created: Mon Dec 04, 2010  9:51 PM       cxr shows some low lung volumes - this could be related to copd      No infiltrate/ pneumonia or masses      This is reassuring       Will disc in more detail at follow up

## 2010-12-06 ENCOUNTER — Telehealth: Payer: Self-pay | Admitting: Cardiovascular Disease

## 2010-12-06 NOTE — Telephone Encounter (Signed)
Patient notified as instructed by telephone. 

## 2010-12-06 NOTE — Telephone Encounter (Signed)
Patient is aware of test/lab results.  

## 2010-12-18 ENCOUNTER — Ambulatory Visit (INDEPENDENT_AMBULATORY_CARE_PROVIDER_SITE_OTHER): Payer: Medicare Other | Admitting: Family Medicine

## 2010-12-18 ENCOUNTER — Encounter: Payer: Self-pay | Admitting: Family Medicine

## 2010-12-18 DIAGNOSIS — J449 Chronic obstructive pulmonary disease, unspecified: Secondary | ICD-10-CM

## 2010-12-18 DIAGNOSIS — J309 Allergic rhinitis, unspecified: Secondary | ICD-10-CM

## 2010-12-18 DIAGNOSIS — G4700 Insomnia, unspecified: Secondary | ICD-10-CM

## 2010-12-18 DIAGNOSIS — R634 Abnormal weight loss: Secondary | ICD-10-CM

## 2010-12-18 DIAGNOSIS — I10 Essential (primary) hypertension: Secondary | ICD-10-CM

## 2010-12-18 DIAGNOSIS — Z125 Encounter for screening for malignant neoplasm of prostate: Secondary | ICD-10-CM

## 2010-12-18 LAB — RENAL FUNCTION PANEL
Albumin: 4.9 g/dL (ref 3.5–5.2)
BUN: 20 mg/dL (ref 6–23)
CO2: 32 mEq/L (ref 19–32)
Calcium: 9.7 mg/dL (ref 8.4–10.5)
Creatinine, Ser: 1.1 mg/dL (ref 0.4–1.5)
Sodium: 140 mEq/L (ref 135–145)

## 2010-12-18 NOTE — Assessment & Plan Note (Signed)
No problems /symptoms Nl DRE today

## 2010-12-18 NOTE — Assessment & Plan Note (Signed)
Much imp with remeron Also better mood and appetite

## 2010-12-18 NOTE — Assessment & Plan Note (Signed)
Control is fine on spironolactone  Lab today No side eff

## 2010-12-18 NOTE — Progress Notes (Signed)
Subjective:    Patient ID: Seth David, male    DOB: [birth year], 65 y.o.   MRN: [id number]  HPI Here for f/u of depression symptoms and wt loss and HTN and cough/ chest x ray and DRE Wt is down 1 lb Is feeling much better   Started a V8 energy drink -and that helped his cough and energy   Had fluid on cxr at urgent care 4 mo ago Also dry cough - ? Ace Our cxr showed some atelectasis and low lung volumes  Also scar tissue in esoph- per pt    Intol of hctz Changed to spironolactone Does not think that made much of a difference  Good bp 118/80  Started on remeron for depressive symptoms and loss of appetite  That works great- appetite has come back Changed from boost to ensure for more protein   Due for DRE No trouble urinating  No nocturia at all  Sleeps much better   Labs overall ok  Reviewed them  Patient Active Problem List  Diagnoses  . NEOPLASM OF UNCERTAIN BEHAVIOR OF SKIN  . HYPERLIPIDEMIA  . ANXIETY  . ALCOHOL ABUSE  . DEPRESSION  . HYPERTENSION  . CAD  . ALLERGIC RHINITIS  . COPD  . GERD  . BARRETTS ESOPHAGUS  . VENTRAL HERNIA  . IRRITABLE BOWEL SYNDROME  . NEPHROLITHIASIS  . UTI  . PROSTATITIS, ACUTE  . INSOMNIA  . SLEEP APNEA  . Prostate cancer screening  . Weight loss   Past Medical History  Diagnosis Date  . Coronary artery disease   . Hypertension   . Hyperlipidemia   . Barrett's esophagus   . COPD (chronic obstructive pulmonary disease)   . Sleep apnea   . Insomnia   . Ventral hernia   . IBS (irritable bowel syndrome)   . GERD (gastroesophageal reflux disease)   . Depression   . Anxiety   . Allergic rhinitis   . Gastritis   . H/O alcohol abuse    Past Surgical History  Procedure Date  . Coronary artery bypass graft 2009  . Colectomy 1995    sigmoid for diverticulitis  . 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 Nissen fundoplication 1985  . Hernia repair 03/2000- 10/2000    ventral  . Gynecomastia excision  03/2000    focal surgery  . Neck surgery     fusion  . Esophagogastroduodenoscopy     Barretts esophagus,stricture,gastritis   History  Substance Use Topics  . Smoking status: Never Smoker   . Smokeless tobacco: Never Used  . Alcohol Use: No     rare   Family History  Problem Relation Age of Onset  . Heart disease Father     deceased  . Stroke Father   . Other Mother     OP  . Anxiety disorder Sister   . Heart disease Other     uncles x 5  . Diabetes Other     paternal -maternal uncle   Allergies  Allergen Reactions  . Aspirin     REACTION: ears ring  . Atorvastatin     REACTION: muscle pain  . Codeine     REACTION: nervous  . Crestor (Rosuvastatin Calcium) Other (See Comments)    Joint pain, could not hardly walk  . Fluoxetine Hcl     REACTION: intolerant  . Hctz (Hydrochlorothiazide)     Malaise   . Morphine     REACTION: nervous  . Paroxetine  REACTION: sleepy  . Vytorin     malaise   Current Outpatient Prescriptions on File Prior to Visit  Medication Sig Dispense Refill  . benazepril (LOTENSIN) 20 MG tablet Take 20 mg by mouth 2 (two) times daily.        . clopidogrel (PLAVIX) 75 MG tablet Take 1 tablet (75 mg total) by mouth daily. GENERIC PLEASE  30 tablet  11  . diphenhydrAMINE (BENADRYL) 25 mg capsule Take 25 mg by mouth at bedtime.       Marland Kitchen LORazepam (ATIVAN) 2 MG tablet Take 2 mg by mouth 3 (three) times daily.       . metoprolol succinate (TOPROL-XL) 25 MG 24 hr tablet Take 25 mg by mouth daily.        . mirtazapine (REMERON SOL-TAB) 15 MG disintegrating tablet Take 1 tablet (15 mg total) by mouth at bedtime.  30 tablet  11  . Multiple Vitamin (MULTIVITAMIN) tablet Take 1 tablet by mouth daily.        . Naproxen Sodium (ALEVE) 220 MG CAPS Take by mouth as needed.        . pravastatin (PRAVACHOL) 80 MG tablet Take 80 mg by mouth at bedtime.       . Psyllium (METAMUCIL) 48.57 % POWD Take 1 scoop by mouth daily.        Marland Kitchen spironolactone (ALDACTONE)  25 MG tablet Take 1 tablet (25 mg total) by mouth daily.  30 tablet  11  . nutritional supplement (BOOST HIGH PROTEIN) LIQD Take 237 mLs by mouth daily.             Review of Systems Review of Systems  Constitutional: Negative for fever, appetite change, and unexpected weight change. fatigue is improving  Eyes: Negative for pain and visual disturbance.  Respiratory: Negative for cough and shortness of breath.   ENT pos for runny nose and post nasal drip Cardiovascular: Negative.  for cp and palpitations  Gastrointestinal: Negative for nausea, diarrhea and constipation.  Genitourinary: Negative for urgency and frequency.  Skin: Negative for pallor. or rash or flushing  Neurological: Negative for weakness, light-headedness, numbness and headaches.  Hematological: Negative for adenopathy. Does not bruise/bleed easily.  Psychiatric/Behavioral: mood is overall better /no further insomnia and less anxious         Objective:   Physical Exam  Constitutional: He appears well-developed and well-nourished. No distress.  HENT:  Head: Normocephalic and atraumatic.  Right Ear: External ear normal.  Left Ear: External ear normal.  Nose: Nose normal.  Mouth/Throat: Oropharynx is clear and moist.       Nares are boggy- some post nasal drainage  Eyes: Conjunctivae and EOM are normal. Pupils are equal, round, and reactive to light.  Neck: Normal range of motion. Neck supple. No JVD present. Carotid bruit is not present. No thyromegaly present.  Cardiovascular: Normal rate, regular rhythm, normal heart sounds and intact distal pulses.   Pulmonary/Chest: Effort normal and breath sounds normal. No respiratory distress. He has no wheezes.       bs are mildly distant  Abdominal: Soft. Bowel sounds are normal. There is no tenderness.  Genitourinary: Rectum normal and prostate normal.  Musculoskeletal: He exhibits no edema and no tenderness.  Lymphadenopathy:    He has no cervical adenopathy.    Neurological: He is alert. He has normal reflexes.  Skin: Skin is warm and dry. No rash noted. No erythema. No pallor.  Psychiatric: He has a normal mood and affect.  Improved mood Cheerful and talkative  Good eye contact and comm skills           Assessment & Plan:

## 2010-12-18 NOTE — Assessment & Plan Note (Signed)
Wt is fairly stable Appetite better on remeron  Is inc portions and calories- no problem

## 2010-12-18 NOTE — Assessment & Plan Note (Signed)
Rev cxr- reassuring  Some atelectasis Cough is much imp

## 2010-12-18 NOTE — Patient Instructions (Addendum)
You can use mucinex for congestion and post nasal drainage  Lab today for electrolytes  I'm glad you are feeling much better  Follow up in about 6 months  Keep an eye on weight and update me if you loose more

## 2010-12-18 NOTE — Assessment & Plan Note (Signed)
Some post nasal drip  Can use plain mucinex prn for congestion

## 2010-12-29 ENCOUNTER — Encounter: Payer: Self-pay | Admitting: Gastroenterology

## 2011-01-01 ENCOUNTER — Other Ambulatory Visit: Payer: Self-pay

## 2011-01-01 MED ORDER — LORAZEPAM 2 MG PO TABS: 2.0000 mg | ORAL_TABLET | Freq: Three times a day (TID) | ORAL | Status: DC

## 2011-01-01 NOTE — Telephone Encounter (Signed)
Medication phoned to [facility]. pharmacy as instructed.

## 2011-01-01 NOTE — Telephone Encounter (Signed)
Px written for call in   

## 2011-01-01 NOTE — Telephone Encounter (Signed)
[facility] faxed refill request for Lorazepam 2mg  taking 1 tablet by mouth 3 times a day. #90 Please advise.

## 2011-01-10 ENCOUNTER — Telehealth: Payer: Self-pay | Admitting: *Deleted

## 2011-01-10 MED ORDER — AZITHROMYCIN 250 MG PO TABS: ORAL_TABLET | ORAL | Status: AC

## 2011-01-10 NOTE — Telephone Encounter (Signed)
Will go ahead and send px in for zpak Follow up next week for re check  I was under the impression that he was IMPROVING when I saw him last -- so this is concerning that cough has become productive  If worse please alert me / or if sob

## 2011-01-10 NOTE — Telephone Encounter (Signed)
Patient notified as instructed by telephone. Pt scheduled appt to see Dr Milinda Antis 01/15/11 at 11:45am.

## 2011-01-10 NOTE — Telephone Encounter (Signed)
Patient was seen on 12-18-10 with cough and congestion. He says that he is still really congested and is coughing up dark green phlegm. He has been taking mucinex and it seemed to help some in the beginning, but now congestion is back as before. He is asking if he can get an antibiotic. [facility] [address]

## 2011-01-15 ENCOUNTER — Ambulatory Visit (INDEPENDENT_AMBULATORY_CARE_PROVIDER_SITE_OTHER): Payer: Medicare Other | Admitting: Family Medicine

## 2011-01-15 ENCOUNTER — Encounter: Payer: Self-pay | Admitting: Family Medicine

## 2011-01-15 VITALS — BP 110/70 | HR 76 | Temp 97.9°F | Resp 20 | Ht 69.0 in | Wt 199.5 lb

## 2011-01-15 DIAGNOSIS — R634 Abnormal weight loss: Secondary | ICD-10-CM

## 2011-01-15 DIAGNOSIS — G4730 Sleep apnea, unspecified: Secondary | ICD-10-CM

## 2011-01-15 DIAGNOSIS — J40 Bronchitis, not specified as acute or chronic: Secondary | ICD-10-CM

## 2011-01-15 DIAGNOSIS — G4700 Insomnia, unspecified: Secondary | ICD-10-CM

## 2011-01-15 NOTE — Assessment & Plan Note (Signed)
New and now much improved after starting zpack  Re assuring exam without any reactive airways  inst to continue fluids and rest and to update if symptoms return

## 2011-01-15 NOTE — Assessment & Plan Note (Signed)
Per pt better to resolved with wt loss

## 2011-01-15 NOTE — Progress Notes (Signed)
Subjective:    Patient ID: Seth David, male    DOB: 10/16/1945, 65 y.o.   MRN: [id number]  HPI Here for f/u of cough 2 visits ago had a cxr that showed some atelectasis  Felt better - suspected poss mild ace cough Then called because cough suddenly became worse and prod-- green phlegm  tx with zpak Still some prod cough and temp is higher than he used  Is not wheezing or having trouble breathing  Now improved   Is using saline nasal spray when he needs it  No longer has sleep apnea    bp good today No cp or sob or palpitations or dizziness Tolerates this med well  Wt stable-- is much better and good appetite  Has lost some muscle  Wants to exercise indoors when it is really hot  Mood is better too  Patient Active Problem List  Diagnoses  . NEOPLASM OF UNCERTAIN BEHAVIOR OF SKIN  . HYPERLIPIDEMIA  . ANXIETY  . ALCOHOL ABUSE  . DEPRESSION  . HYPERTENSION  . CAD  . ALLERGIC RHINITIS  . COPD  . GERD  . BARRETTS ESOPHAGUS  . VENTRAL HERNIA  . IRRITABLE BOWEL SYNDROME  . NEPHROLITHIASIS  . PROSTATITIS, ACUTE  . INSOMNIA  . SLEEP APNEA  . Prostate cancer screening  . Weight loss  . Bronchitis   Past Medical History  Diagnosis Date  . Coronary artery disease   . Hypertension   . Hyperlipidemia   . Barrett's esophagus   . COPD (chronic obstructive pulmonary disease)   . Sleep apnea   . Insomnia   . Ventral hernia   . IBS (irritable bowel syndrome)   . GERD (gastroesophageal reflux disease)   . Depression   . Anxiety   . Allergic rhinitis   . Gastritis   . H/O alcohol abuse    Past Surgical History  Procedure Date  . Coronary artery bypass graft 2009  . Colectomy 1995    sigmoid for diverticulitis  . 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 Nissen fundoplication 1985  . Hernia repair 03/2000- 10/2000    ventral  . Gynecomastia excision 03/2000    focal surgery  . Neck surgery     fusion  . Esophagogastroduodenoscopy     Barretts  esophagus,stricture,gastritis   History  Substance Use Topics  . Smoking status: Never Smoker   . Smokeless tobacco: Never Used  . Alcohol Use: No     rare   Family History  Problem Relation Age of Onset  . Heart disease Father     deceased  . Stroke Father   . Other Mother     OP  . Anxiety disorder Sister   . Heart disease Other     uncles x 5  . Diabetes Other     paternal -maternal uncle   Allergies  Allergen Reactions  . Aspirin     REACTION: ears ring  . Atorvastatin     REACTION: muscle pain  . Codeine     REACTION: nervous  . Crestor (Rosuvastatin Calcium) Other (See Comments)    Joint pain, could not hardly walk  . Fluoxetine Hcl     REACTION: intolerant  . Hctz (Hydrochlorothiazide)     Malaise   . Morphine     REACTION: nervous  . Paroxetine     REACTION: sleepy  . Vytorin     malaise   Current Outpatient Prescriptions on File Prior to Visit  Medication Sig Dispense Refill  . benazepril (LOTENSIN) 20 MG tablet Take 20 mg by mouth 2 (two) times daily.        . clopidogrel (PLAVIX) 75 MG tablet Take 1 tablet (75 mg total) by mouth daily. GENERIC PLEASE  30 tablet  11  . diphenhydrAMINE (BENADRYL) 25 mg capsule Take 25 mg by mouth at bedtime.       Marland Kitchen LORazepam (ATIVAN) 2 MG tablet Take 1 tablet (2 mg total) by mouth 3 (three) times daily.  90 tablet  5  . metoprolol succinate (TOPROL-XL) 25 MG 24 hr tablet Take 25 mg by mouth daily.        . mirtazapine (REMERON SOL-TAB) 15 MG disintegrating tablet Take 1 tablet (15 mg total) by mouth at bedtime.  30 tablet  11  . Multiple Vitamin (MULTIVITAMIN) tablet Take 1 tablet by mouth daily.        . pravastatin (PRAVACHOL) 80 MG tablet Take 80 mg by mouth at bedtime.       . Psyllium (METAMUCIL) 48.57 % POWD Take 1 scoop by mouth daily.        Marland Kitchen spironolactone (ALDACTONE) 25 MG tablet Take 1 tablet (25 mg total) by mouth daily.  30 tablet  11  . Naproxen Sodium (ALEVE) 220 MG CAPS Take by mouth as needed.          . nutritional supplement (BOOST HIGH PROTEIN) LIQD Take 237 mLs by mouth daily.           Review of Systems Review of Systems  Constitutional: Negative for fever, appetite change, fatigue and unexpected weight change.  Eyes: Negative for pain and visual disturbance.  Respiratory: Negative for shortness of breath.  pos for prod cough that is improving  Cardiovascular: Negative. For cp or sob or palpitations  Gastrointestinal: Negative for nausea, diarrhea and constipation.  Genitourinary: Negative for urgency and frequency.  Skin: Negative for pallor. or rash  Neurological: Negative for weakness, light-headedness, numbness and headaches.  Hematological: Negative for adenopathy. Does not bruise/bleed easily.  Psychiatric/Behavioral: Negative for dysphoric mood. The patient is not nervous/anxious.          Objective:   Physical Exam  Constitutional: He appears well-developed and well-nourished. No distress.  HENT:  Head: Normocephalic and atraumatic.  Right Ear: External ear normal.  Left Ear: External ear normal.  Nose: Nose normal.  Mouth/Throat: Oropharynx is clear and moist.       Some post nasal drip noted   Eyes: Conjunctivae and EOM are normal. Pupils are equal, round, and reactive to light.  Neck: Normal range of motion. Neck supple. No JVD present. No thyromegaly present.  Cardiovascular: Normal rate, regular rhythm, normal heart sounds and intact distal pulses.   Pulmonary/Chest: Effort normal. No respiratory distress. He has no wheezes. He has no rales. He exhibits no tenderness.       bs are generally harsh and distant  occ harsh cough  No resp distress  Abdominal: Soft. Bowel sounds are normal. He exhibits no distension and no mass. There is no tenderness.  Musculoskeletal: Normal range of motion. He exhibits no edema and no tenderness.  Lymphadenopathy:    He has no cervical adenopathy.  Neurological: He is alert. He has normal reflexes. Coordination normal.   Skin: Skin is warm and dry. No rash noted. No erythema. No pallor.  Psychiatric: He has a normal mood and affect.          Assessment & Plan:

## 2011-01-15 NOTE — Assessment & Plan Note (Signed)
Much better on remeron

## 2011-01-15 NOTE — Assessment & Plan Note (Signed)
Leveled out on remeron and appetite back

## 2011-01-15 NOTE — Patient Instructions (Signed)
Lung exam is re- assuring today  I suspect it will take 1-2 weeks more to get over this lung infection  Drink fluids and get enough rest  Blood pressure is good  I'm glad appetite and sleep are good! Follow up in 6 months

## 2011-01-30 ENCOUNTER — Telehealth: Payer: Self-pay | Admitting: *Deleted

## 2011-01-30 MED ORDER — ZOSTER VACCINE LIVE 19400 UNT/0.65ML ~~LOC~~ SOLR: 0.6500 mL | Freq: Once | SUBCUTANEOUS | Status: DC

## 2011-01-30 NOTE — Telephone Encounter (Signed)
Patient wants to have the shingles vaccine. He would like rx sent to [facility] [address]

## 2011-01-30 NOTE — Telephone Encounter (Signed)
Let him know I will send the px electronically

## 2011-01-30 NOTE — Telephone Encounter (Signed)
Patient notified as instructed by telephone. 

## 2011-02-09 ENCOUNTER — Encounter: Payer: Self-pay | Admitting: Family Medicine

## 2011-02-09 ENCOUNTER — Ambulatory Visit (INDEPENDENT_AMBULATORY_CARE_PROVIDER_SITE_OTHER): Payer: Medicare Other | Admitting: Family Medicine

## 2011-02-09 VITALS — BP 112/82 | HR 82 | Temp 97.6°F | Wt 198.2 lb

## 2011-02-09 DIAGNOSIS — I10 Essential (primary) hypertension: Secondary | ICD-10-CM

## 2011-02-09 DIAGNOSIS — E785 Hyperlipidemia, unspecified: Secondary | ICD-10-CM

## 2011-02-09 NOTE — Assessment & Plan Note (Signed)
No change in meds for now.  Will calibrate home cuff and will d/w PMD re: his case.

## 2011-02-09 NOTE — Patient Instructions (Addendum)
Don't change your meds for now.  Bring your cuff to calibrate here at the clinic.  I'll talk to Dr. Milinda Antis in the meantime.  Keep moving your left arm in the meantime.

## 2011-02-09 NOTE — Progress Notes (Signed)
"  I don't know if my machine is off."  Here to check up on BP.  He had elevated BP on vytorin.  On his BP machine at home, his BP has been 130-140/90-100.  Checks pressure in AM, after urination.  BP isn't high today; wasn't high at last OV.    He had lost weight due to malaise on vytorin.  He was switched back to pravastatin but still had some weight loss.  He feels better on pravastatin than on the vytorin.  He had dec in muscle mass in arms and shoulders.    He's eating well, but has a lack of energy.  Sleeping well on mirtazapine.    Meds, vitals, and allergies reviewed.   ROS: See HPI.  Otherwise, noncontributory.  Recheck  BP L-118/78, R-1116/80  nad ncat Mmm Nec, supple  L shoulder with normal PROM but dec in AROM- forward flexion and abduction limited to <90deg.  No impingement on exam. rrr ctab Ext w/o edema

## 2011-02-11 NOTE — Assessment & Plan Note (Signed)
Continue current meds for now and I'll d/w PMD about his case.

## 2011-02-13 ENCOUNTER — Telehealth: Payer: Self-pay | Admitting: *Deleted

## 2011-02-13 DIAGNOSIS — M25512 Pain in left shoulder: Secondary | ICD-10-CM

## 2011-02-13 NOTE — Telephone Encounter (Signed)
I will do ref for L shoulder pain and decreased range of movement

## 2011-02-13 NOTE — Telephone Encounter (Signed)
Patient says you know all about the problems with his left arm.  He is requesting to see a physical therapist for it.  It is getting worse.  He prefers the [place] area for the PT.

## 2011-03-21 ENCOUNTER — Ambulatory Visit (INDEPENDENT_AMBULATORY_CARE_PROVIDER_SITE_OTHER)
Admission: RE | Admit: 2011-03-21 | Discharge: 2011-03-21 | Disposition: A | Discharge status: Home / Self Care | Payer: Medicare Other | Source: Ambulatory Visit | Attending: Family Medicine | Admitting: Family Medicine

## 2011-03-21 ENCOUNTER — Encounter: Payer: Self-pay | Admitting: Family Medicine

## 2011-03-21 ENCOUNTER — Ambulatory Visit (INDEPENDENT_AMBULATORY_CARE_PROVIDER_SITE_OTHER): Payer: Medicare Other | Admitting: Family Medicine

## 2011-03-21 ENCOUNTER — Encounter: Payer: Self-pay | Admitting: Neurology

## 2011-03-21 DIAGNOSIS — R05 Cough: Secondary | ICD-10-CM

## 2011-03-21 DIAGNOSIS — M5382 Other specified dorsopathies, cervical region: Secondary | ICD-10-CM

## 2011-03-21 DIAGNOSIS — R29898 Other symptoms and signs involving the musculoskeletal system: Secondary | ICD-10-CM

## 2011-03-21 NOTE — Assessment & Plan Note (Signed)
Bilateral arm weakness worse on the L - no imp with PT exercises Will hold pravachol in case of poss myopathy X ray neck  Ref to neuro
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
## 2011-03-21 NOTE — Patient Instructions (Signed)
Hold your pravachol - do not take it for now  X rays today - and I will update you with reports  We will do neurology referral at check out for weakness  Keep eating a healthy diet

## 2011-03-21 NOTE — Assessment & Plan Note (Signed)
Pt has hx of fusion Several mo of dec neck strength - without pain- now trouble holding it up Also arm weakness Check cs x ray Ref to neurol
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
## 2011-03-22 ENCOUNTER — Other Ambulatory Visit: Payer: Self-pay

## 2011-03-22 MED ORDER — AZITHROMYCIN 250 MG PO TABS: ORAL_TABLET | ORAL | Status: AC

## 2011-03-22 NOTE — Telephone Encounter (Signed)
Medication phoned to [facility] [address] [facility] pharmacy as instructed. Spoke with Brett Canales and he said for instructions put as directed and it will automatically come up with correct instructions. If further info needed refer to result notes for chest xray and CS.

## 2011-03-27 ENCOUNTER — Ambulatory Visit (INDEPENDENT_AMBULATORY_CARE_PROVIDER_SITE_OTHER): Payer: Medicare Other | Admitting: Neurology

## 2011-03-27 ENCOUNTER — Other Ambulatory Visit (INDEPENDENT_AMBULATORY_CARE_PROVIDER_SITE_OTHER): Payer: Medicare Other

## 2011-03-27 ENCOUNTER — Encounter: Payer: Self-pay | Admitting: Neurology

## 2011-03-27 VITALS — BP 112/82 | HR 84 | Wt 191.0 lb

## 2011-03-27 DIAGNOSIS — M5382 Other specified dorsopathies, cervical region: Secondary | ICD-10-CM

## 2011-03-27 NOTE — Progress Notes (Signed)
Dear Dr. Milinda Antis,  Thank you for having me see Seth David in consultation today at [facility] for his problem with upper extremity weakness.  As you may recall, he is a 65 y.o. year old male with a history of C5-C7 cervical fusion who presents with progressive weakness of his bilateral upper extremities, as well as difficulty holding his head up.  This started after starting on Vytorin in October-November of 2011.  His girlfriend who accompanies him has also noticed changes in his speech with a more nasal quality.  He also has some difficulty swallowing.  He notes orthopnea as well.  He denies numbness.  He has a baseline tremor but this has not changed.  He denies bladder problems, but has developed erectile dysfunction.  He denies muscle pain or aching.  He has had no ptosis or diplopia.  He has not noticed any fasciculations.  There is no diurnal fluctuation.  Medical History: HTN, HLD   Surgical History: CDF 90s - C5-C7   Social History: No tobacco, no EtOH.  Retired from [employer].  Family History: No neurologic disease.   ROS:  13 systems were reviewed and are notable for weight loss, fatigue, orthopnea, difficulty with balance.  All other review of systems are unremarkable.   Examination:  Filed Vitals:   03/27/11 0809  BP: 112/82  Pulse: 84  Weight: 191 lb (86.637 kg)     In general, 65 year old man who appears to have a decreased affect.  Cardiovascular: The patient has a regular rate and rhythm and no carotid bruits.  Fundoscopy:  Disks are flat. Vessel caliber within normal limits.  Mental status:   The patient is oriented to person, place and time. Recent and remote memory are intact. Attention span and concentration are normal. Language including repetition, naming, following commands are intact. Fund of knowledge of current and historical events, as well as vocabulary are normal.  Cranial Nerves: Pupils are equally round and reactive to light. Visual  fields full to confrontation. Extraocular movements are intact without nystagmus. Facial sensation and muscles of mastication are intact. Muscles of facial expression are symmetric but reveal weakness on attempting to puff out the cheeks. Hearing intact to bilateral finger rub. Tongue protrusion, uvula, palate midline.  Palate appears to elevate well.  Tongue appears strong - no obvious fasciculations. Shoulder shrug intact.  + jaw jerk.  Decreased blink rate  Motor:  3/5 SA weakness. 4/5 rest of the UE bilaterally.  Neck extension 4/5(head drop).  Neck flexion(4+/5).  Lower extremities 5/5 except for HF 4+/5.  +upper extremity fascics .  Obvious wasting of the shoulder girdle muscles as well as thenar muscles bilaterally.  Reflexes:  Are absent in the upper extremities.  2+ at knees, absent at ankles.  Toes down.  No Hoffman's.  Coordination:  Normal finger to nose.  No dysdiadokinesia.  Sensation is intact to temperature.  Gait and Station are slow.   Romberg is negative   Impression: Aneudy Champlain is a 65 year old man with progressive upper extremity weakness and head drop.  Exam reveals both mainly lower motor neuron signs, but also has positive jaw jerk, and relatively brisk knee jerks.  My concern is more motor neuron disease.  While this could be coming from the neck I think it is much less likely given some of the cranial nerve findings.  I don't think this is a primary muscle disease nor a disease of the NMJ.     Recommendations:  I am going to get an MRI C-spine as well as an EMG/NCS of his upper and lower extremity as well as paraspinal muscles.  If appropriate EMG of the tongue may be helpful.  I am also going to get a swallowing evaluation and CK.   We will see the patient back in about 1 month after the studies are complete.  Thank you for having [place] Seth David in consultation.  Feel free to contact me with any questions.  Lupita Raider Modesto Charon, MD [facility], [place] [address] [place], [place] [address] Phone: [phone] Fax: [phone].

## 2011-03-27 NOTE — Patient Instructions (Signed)
You have been scheduled for nerve conduction studies/electromyelogram on Monday, October 1st at 10:00am at [facility] [address] [place], [place] [address].  The C-spine MRI is also on Monday, October 1st at 5:00pm at [facility] please arrive by 4:45pm.  Speech Pathology will call you to set up that appointment.  If you do not hear from them in the next couple of days please give them a call at [phone].  You can go to [facility] at [address] for a cervical collar.  They are across from [facility] center.

## 2011-03-28 LAB — CREATININE, SERUM: GFR calc non Af Amer: >60

## 2011-03-28 LAB — CBC
HCT: 38.9 — ABNORMAL LOW
HCT: 40.9
HCT: 41
HCT: 47.1
Hemoglobin: 13.2
Hemoglobin: 14
Hemoglobin: 14.5
MCHC: 34
MCHC: 34.8
MCHC: 35.2
MCV: 83.7
MCV: 84.2
MCV: 84.3
MCV: 84.5
MCV: 85.3
MCV: 85.6
Platelets: 186
Platelets: 295
Platelets: 302
RBC: 4.38
RBC: 4.55
RBC: 5.63
RBC: 5.97 — ABNORMAL HIGH
RDW: 13.3
RDW: 13.7
RDW: 13.9
WBC: 11.8 — ABNORMAL HIGH
WBC: 12 — ABNORMAL HIGH
WBC: 12.2 — ABNORMAL HIGH
WBC: 14.7 — ABNORMAL HIGH
WBC: 8.4

## 2011-03-28 LAB — MAGNESIUM
Magnesium: 2.3
Magnesium: 2.7 — ABNORMAL HIGH

## 2011-03-28 LAB — POCT I-STAT 3, VENOUS BLOOD GAS (G3P V)
Acid-base deficit: 3 — ABNORMAL HIGH
Bicarbonate: 23.9
TCO2: 25
pH, Ven: 7.287

## 2011-03-28 LAB — POCT I-STAT 3, ART BLOOD GAS (G3+)
Acid-Base Excess: 5 — ABNORMAL HIGH
Acid-base deficit: 1
Acid-base deficit: 2
Bicarbonate: 24.2 — ABNORMAL HIGH
Bicarbonate: 32.4 — ABNORMAL HIGH
O2 Saturation: 100
O2 Saturation: 100
Operator id: 137421
TCO2: 25
TCO2: 25
TCO2: 34
pCO2 arterial: 34.1 — ABNORMAL LOW
pCO2 arterial: 46 — ABNORMAL HIGH
pCO2 arterial: 58.4
pH, Arterial: 7.415
pH, Arterial: 7.451 — ABNORMAL HIGH
pO2, Arterial: 109 — ABNORMAL HIGH
pO2, Arterial: 225 — ABNORMAL HIGH
pO2, Arterial: 262 — ABNORMAL HIGH
pO2, Arterial: 60 — ABNORMAL LOW

## 2011-03-28 LAB — HEMOGLOBIN AND HEMATOCRIT, BLOOD
HCT: 34.5 — ABNORMAL LOW
Hemoglobin: 11.9 — ABNORMAL LOW

## 2011-03-28 LAB — BASIC METABOLIC PANEL
BUN: 14
BUN: 16
BUN: 19
CO2: 26
CO2: 28
CO2: 29
Chloride: 101
Chloride: 102
Chloride: 102
Chloride: 103
Chloride: 105
Creatinine, Ser: 1.15
Creatinine, Ser: 1.19
Creatinine, Ser: 1.33
Creatinine, Ser: 1.41
GFR calc Af Amer: >60
GFR calc Af Amer: >60
Glucose, Bld: 103 — ABNORMAL HIGH
Potassium: 3.5
Potassium: 3.7
Potassium: 4.2
Sodium: 138

## 2011-03-28 LAB — URINALYSIS, ROUTINE W REFLEX MICROSCOPIC
Bilirubin Urine: NEGATIVE
Glucose, UA: NEGATIVE
Hgb urine dipstick: NEGATIVE
Ketones, ur: NEGATIVE
Protein, ur: NEGATIVE
Urobilinogen, UA: 0.2

## 2011-03-28 LAB — POCT I-STAT, CHEM 8
Chloride: 104
HCT: 42
Potassium: 3.7
Sodium: 140

## 2011-03-28 LAB — POCT I-STAT 4, (NA,K, GLUC, HGB,HCT)
Glucose, Bld: 109 — ABNORMAL HIGH
Glucose, Bld: 109 — ABNORMAL HIGH
HCT: 33 — ABNORMAL LOW
HCT: 33 — ABNORMAL LOW
HCT: 47
Hemoglobin: 11.2 — ABNORMAL LOW
Hemoglobin: 11.2 — ABNORMAL LOW
Hemoglobin: 11.2 — ABNORMAL LOW
Operator id: 137421
Operator id: 3291
Operator id: 3291
Operator id: 3291
Operator id: 3291
Potassium: 3.9
Potassium: 4
Potassium: 4.8
Potassium: 5.1
Sodium: 127 — ABNORMAL LOW
Sodium: 136
Sodium: 137
Sodium: 139

## 2011-03-28 LAB — BLOOD GAS, ARTERIAL
Bicarbonate: 22.6
FIO2: 0.21
Patient temperature: 98.6
TCO2: 23.6
pH, Arterial: 7.464 — ABNORMAL HIGH
pO2, Arterial: 87.3

## 2011-03-28 LAB — PLATELET COUNT: Platelets: 197

## 2011-03-28 LAB — PROTIME-INR
INR: 1
INR: 1.3
Prothrombin Time: 13.5

## 2011-03-28 LAB — TYPE AND SCREEN
ABO/RH(D): O POS
Antibody Screen: NEGATIVE

## 2011-03-28 LAB — APTT: aPTT: 35

## 2011-03-28 LAB — ABO/RH: ABO/RH(D): O POS

## 2011-04-02 ENCOUNTER — Ambulatory Visit (HOSPITAL_COMMUNITY)
Admission: RE | Admit: 2011-04-02 | Discharge: 2011-04-02 | Disposition: A | Discharge status: Home / Self Care | Payer: Medicare Other | Source: Ambulatory Visit | Attending: Neurology | Admitting: Neurology

## 2011-04-02 DIAGNOSIS — M5382 Other specified dorsopathies, cervical region: Secondary | ICD-10-CM

## 2011-04-03 NOTE — Progress Notes (Signed)
Received results of EMG/NCS done 04/02/2011.  Clear denervation reinnervation of 3 limbs and 2 non-limb muscles.  Consistent with ALS.  Patient has been referred to [facility] with Dr. Cornelious Bryant.  However, I would like to see the patient back asap to discuss results as well.

## 2011-04-06 ENCOUNTER — Encounter: Payer: Self-pay | Admitting: Neurology

## 2011-04-06 ENCOUNTER — Ambulatory Visit: Payer: Medicare Other | Admitting: Neurology

## 2011-04-09 ENCOUNTER — Telehealth: Payer: Self-pay | Admitting: Neurology

## 2011-04-09 NOTE — Telephone Encounter (Signed)
Pt's significant other called to reschedule pt's fu appt due to her work schedule. She would like you to call her back regarding the results so that she can be more prepared for pt's appt on 04/18/11. She was audibly upset and on the verge of tears. °

## 2011-04-09 NOTE — Telephone Encounter (Signed)
Pt's significant other called to reschedule pt's fu appt due to her work schedule. She would like you to call her back regarding the results so that she can be more prepared for pt's appt on 04/18/11. She was audibly upset and on the verge of tears.

## 2011-04-09 NOTE — Telephone Encounter (Signed)
spoke to patient's partner.  Patient was unable to complete C-spine MRI.  Still awaiting swallowing study.  They will see me on the 17th and we will discuss further testing (multifocal motor neuropathy, heavy metals).  If you could contact he ALS clinic at [facility] and see if they got his information, because he needs to be referred there.

## 2011-04-12 ENCOUNTER — Ambulatory Visit: Payer: Medicare Other | Admitting: Neurology

## 2011-04-18 ENCOUNTER — Other Ambulatory Visit: Payer: Medicare Other

## 2011-04-18 ENCOUNTER — Encounter: Payer: Self-pay | Admitting: Neurology

## 2011-04-18 ENCOUNTER — Ambulatory Visit (INDEPENDENT_AMBULATORY_CARE_PROVIDER_SITE_OTHER): Payer: Medicare Other | Admitting: Neurology

## 2011-04-18 VITALS — BP 110/78 | HR 92 | Wt 184.0 lb

## 2011-04-18 DIAGNOSIS — G1221 Amyotrophic lateral sclerosis: Secondary | ICD-10-CM

## 2011-04-18 NOTE — Patient Instructions (Signed)
Go to the basement to have your labs drawn today.  We will fax your referral to the [facility] clinic.

## 2011-04-18 NOTE — Progress Notes (Signed)
Dear Dr. Milinda Antis,  I saw  Seth David back in [facility] for his problem with diffuse muscle weakness.  As you may recall, he is a 65 y.o. year old male with a history of upper arm weakness and head drop who I first met at the end of September.  Despite his history of a C5-C7 cervical spine fusion I suspected that he had motor neuron disease due to his clinical signs of denervation at all levels of his neuroaxis.  EMG/NCS done by Dr. Stacy Gardner confirmed widespread denervation including his hypoglossus and paraspinal muscles solidifying the diagnosis of ALS.  They return today for a discussion about the diagnosis.  Seth David is not having any swallowing issues but is having orthopnea and even shortness of breath when he walks.  He has a general lack of appetite.  He believes his breathing issues are because of clogged sinuses.  He also believes that his weakness is from being put on Vytorin.   Medical history, social history, family history, medications and allergies were reviewed and have not changed since the last clinic vist.  ROS:  13 systems were reviewed and are notable for shortness of breath, weakness in the arms and legs, nasal congestion.  All other review of systems are unremarkable.  Exam: . Filed Vitals:   04/18/11 0806  BP: 110/78  Pulse: 92  Weight: 184 lb (83.462 kg)    In general, thin appearing man, looking dysphoric.  Mental status:   The patient is oriented to person, place and time. Recent and remote memory are intact. Attention span and concentration are normal. Language including repetition, naming, following commands are intact. Fund of knowledge of current and historical events, as well as vocabulary are normal.  Cranial Nerves: Extraocular movements are intact without nystagmus. Facial sensation and muscles of mastication are intact. Muscles of facial expression are symmetric but there is weakness of eye closure and he cannot puff out his cheeks.  Hearing intact to bilateral finger rub. Tongue protrusion, uvula, palate midline. However, tongue deviation is week. Shoulder shrug strong.  Head drop with significant neck extension weakness and neck flexion less weak.  Motor:  Diffuse atrophy. Diffuse fasciculations. UE 4-/5 diffusely. Lower extremities 4 - 4+/5.  Reflexes:  Quiet throughout except at knees which were 3+.  Coordination:  Normal finger to nose  Gait:  Stooped gait.  Impression:  Possible motor neuron disease.  Because of the prominent lower motor neuron findings and relative paucity of UMN findings I am going to send of antibody testing for multifocal motor neuropathy.  I am also going to get heavy metal screening.  However, I think an alternative diagnosis is very unlikely.  Recommendations:  The patient would like to be referred to the [facility] clinic and we will proceed with this.  They are welcome to return to me for care, but I think they would be best served in a multidisciplinary clinic.  I will leave followup on a PRN basis.   Lupita Raider Modesto Charon, MD [facility], [place]

## 2011-04-19 ENCOUNTER — Telehealth: Payer: Self-pay | Admitting: Family Medicine

## 2011-04-19 NOTE — Telephone Encounter (Signed)
Please let pt know that I reviewed his last note from Dr Modesto Charon- and it was recommended he be referred to [facility]- please ask him if he needs a referral for this or if he already has an appt-thanks

## 2011-04-20 NOTE — Telephone Encounter (Signed)
Patient notified as instructed by telephone. Pt said Andrey Campanile was making the appt and he does not need referral.

## 2011-04-24 ENCOUNTER — Telehealth: Payer: Self-pay | Admitting: Neurology

## 2011-04-24 NOTE — Telephone Encounter (Signed)
Pt's fiance wants to know if we ever sent a referral to the [facility] clinic for pt.

## 2011-04-24 NOTE — Telephone Encounter (Signed)
Yes we did.  Tiffany should be able to give more info.

## 2011-04-25 NOTE — Telephone Encounter (Signed)
Left VM w/ fiance that we have sent a referral to [facility] clinic and we are waiting to hear back about scheduling the appointment.

## 2011-06-27 ENCOUNTER — Encounter: Payer: Self-pay | Admitting: Cardiovascular Disease

## 2011-06-27 ENCOUNTER — Ambulatory Visit (INDEPENDENT_AMBULATORY_CARE_PROVIDER_SITE_OTHER): Payer: Medicare Other | Admitting: Cardiovascular Disease

## 2011-06-27 VITALS — BP 140/80 | HR 87 | Ht 70.0 in | Wt 173.4 lb

## 2011-06-27 DIAGNOSIS — I2510 Atherosclerotic heart disease of native coronary artery without angina pectoris: Secondary | ICD-10-CM

## 2011-06-27 DIAGNOSIS — I10 Essential (primary) hypertension: Secondary | ICD-10-CM

## 2011-06-27 NOTE — Progress Notes (Signed)
HPI:  65 year old gentleman presented for follow up of coronary artery disease. The patient had multivessel coronary bypass surgery in 2009. His left ventricular systolic function has been normal. He has developed generalized weakness and head drop. Unfortunately, he has been diagnosed with ALS. He is being treated at [facility]. The patient has been very inactive because of his weakness. He's had a mild pain in his left chest that responds to heat over the chest wall. He has developed shortness of breath and orthopnea. He denies leg swelling or PND. He said no palpitations.  Outpatient Encounter Prescriptions as of 06/27/2011  Medication Sig Dispense Refill  . benazepril (LOTENSIN) 20 MG tablet Take 20 mg by mouth 2 (two) times daily.        . clopidogrel (PLAVIX) 75 MG tablet Take 1 tablet (75 mg total) by mouth daily. GENERIC PLEASE  30 tablet  11  . diphenhydrAMINE (BENADRYL) 25 mg capsule Take 25 mg by mouth at bedtime.       . fluticasone (FLONASE) 50 MCG/ACT nasal spray Place 2 sprays into the nose daily.        Marland Kitchen LORazepam (ATIVAN) 2 MG tablet Take 1 tablet (2 mg total) by mouth 3 (three) times daily.  90 tablet  5  . mirtazapine (REMERON SOL-TAB) 15 MG disintegrating tablet Take 1 tablet (15 mg total) by mouth at bedtime.  30 tablet  11  . Multiple Vitamin (MULTIVITAMIN) tablet Take 1 tablet by mouth daily.        . Multiple Vitamins-Minerals (DAILY ENERGY ENFUSION PO) V8 energy fusion liquid as directed       . Naproxen Sodium (ALEVE) 220 MG CAPS Take by mouth as needed.        . Nutritional Supplements (ENSURE MUSCLE HEALTH REVIGOR) LIQD Take by mouth daily.        . Omega-3 Fatty Acids (FISH OIL) 1200 MG CAPS Take 1 capsule by mouth daily.        . pravastatin (PRAVACHOL) 80 MG tablet Take 80 mg by mouth at bedtime.       . Psyllium (METAMUCIL) 48.57 % POWD Take 1 scoop by mouth daily.        Marland Kitchen spironolactone (ALDACTONE) 25 MG tablet Take 1 tablet (25 mg total) by mouth daily.  30 tablet  11    . DISCONTD: zoster vaccine live, PF, (ZOSTAVAX) 16109 UNT/0.65ML injection Inject 19,400 Units into the skin once.  1 vial  0  . DISCONTD: metoprolol succinate (TOPROL-XL) 25 MG 24 hr tablet Take 25 mg by mouth daily.          Allergies  Allergen Reactions  . Aspirin     REACTION: ears ring  . Atorvastatin     REACTION: muscle pain  . Codeine     REACTION: nervous  . Crestor (Rosuvastatin Calcium) Other (See Comments)    Joint pain, could not hardly walk  . Fluoxetine Hcl     REACTION: intolerant  . Hctz (Hydrochlorothiazide)     Malaise   . Morphine     REACTION: nervous  . Paroxetine     REACTION: sleepy  . Vytorin     malaise    Past Medical History  Diagnosis Date  . Coronary artery disease   . Hypertension   . Hyperlipidemia   . Barrett's esophagus   . COPD (chronic obstructive pulmonary disease)   . Sleep apnea   . Insomnia   . Ventral hernia   . IBS (irritable bowel syndrome)   .  GERD (gastroesophageal reflux disease)   . Depression   . Anxiety   . Allergic rhinitis   . Gastritis   . H/O alcohol abuse     ROS: Negative except as per HPI  BP 140/80  Pulse 87  Ht 5\' 10"  (1.778 m)  Wt 78.654 kg (173 lb 6.4 oz)  BMI 24.88 kg/m2  PHYSICAL EXAM: Pt is alert and oriented, chronically ill-appearing male in NAD HEENT: normal  Neck: JVP - normal Lungs: diminished breath sounds bilaterally CV: RRR without murmur or gallop Abd: soft, NT, Positive BS, no hepatomegaly Ext: no C/C/E Skin: warm/dry no rash  EKG:  Normal sinus rhythm 87 beats per minute, inferior infarct age undetermined, cannot rule out anterior infarct age undetermined.  ASSESSMENT AND PLAN:

## 2011-06-27 NOTE — Assessment & Plan Note (Signed)
The patient is stable without angina. He is on aspirin, Plavix, statin drug, and an ACE inhibitor. His cardiac status is stable.

## 2011-06-27 NOTE — Assessment & Plan Note (Signed)
Blood pressure is controlled on current medical program.

## 2011-06-27 NOTE — Patient Instructions (Signed)
Your physician wants you to follow-up in: 1 YEAR.  You will receive a reminder letter in the mail two months in advance. If you don't receive a letter, please call our office to schedule the follow-up appointment.  Your physician recommends that you continue on your current medications as directed. Please refer to the Current Medication list given to you today.  

## 2011-07-04 ENCOUNTER — Other Ambulatory Visit: Payer: Self-pay | Admitting: Internal Medicine

## 2011-07-04 MED ORDER — BENAZEPRIL HCL 20 MG PO TABS: 20.0000 mg | ORAL_TABLET | Freq: Two times a day (BID) | ORAL | Status: AC

## 2011-07-04 MED ORDER — LORAZEPAM 2 MG PO TABS: 2.0000 mg | ORAL_TABLET | Freq: Three times a day (TID) | ORAL | Status: AC

## 2011-07-04 NOTE — Telephone Encounter (Signed)
Rx request 

## 2011-07-04 NOTE — Telephone Encounter (Signed)
Px written for call in   

## 2011-07-04 NOTE — Telephone Encounter (Signed)
Rx's called in as directed.  

## 2011-07-06 ENCOUNTER — Telehealth: Payer: Self-pay | Admitting: Family Medicine

## 2011-07-06 NOTE — Telephone Encounter (Signed)
Left v/m for Seth David to call back. 

## 2011-07-06 NOTE — Telephone Encounter (Signed)
Refills request.  Please call her back.  Thanks for refill.

## 2011-07-08 DIAGNOSIS — I469 Cardiac arrest, cause unspecified: Secondary | ICD-10-CM

## 2011-07-09 NOTE — Telephone Encounter (Signed)
Left v/m for Dois Davenport to call back.

## 2011-07-10 NOTE — Telephone Encounter (Signed)
Spoke with Dois Davenport and she said pt would not need any refills. Pt is at [facility] on ventilator. Pt had stopped breathing. Dois Davenport said she does not think pt will make it. I explained we had been trying to reach her and she said it was late when she would get home. I asked her to call if there was anything we could do.

## 2011-07-10 NOTE — Telephone Encounter (Signed)
Thanks- I'm so sorry to hear that- he has been battling ALS Please send for H and P from [facility] when available so I can follow

## 2011-08-03 NOTE — Telephone Encounter (Signed)
Spoke with Carly in medical records and she said to fax request and when H&P is available will fax to Dr Milinda Antis. Request for H&P faxed to [facility] medical records [phone] atten Carly.

## 2011-08-03 DEATH — deceased

## 2012-03-28 ENCOUNTER — Encounter: Payer: Self-pay | Admitting: Gastroenterology

## 2012-10-04 IMAGING — CR DG CHEST 1V PORT
1 series · 1 of 1 positions shown · non-contrast
Comparison: none

REASON FOR EXAM: altered mental status, post-arrest, post-intubation
COMMENTS:

[ap]
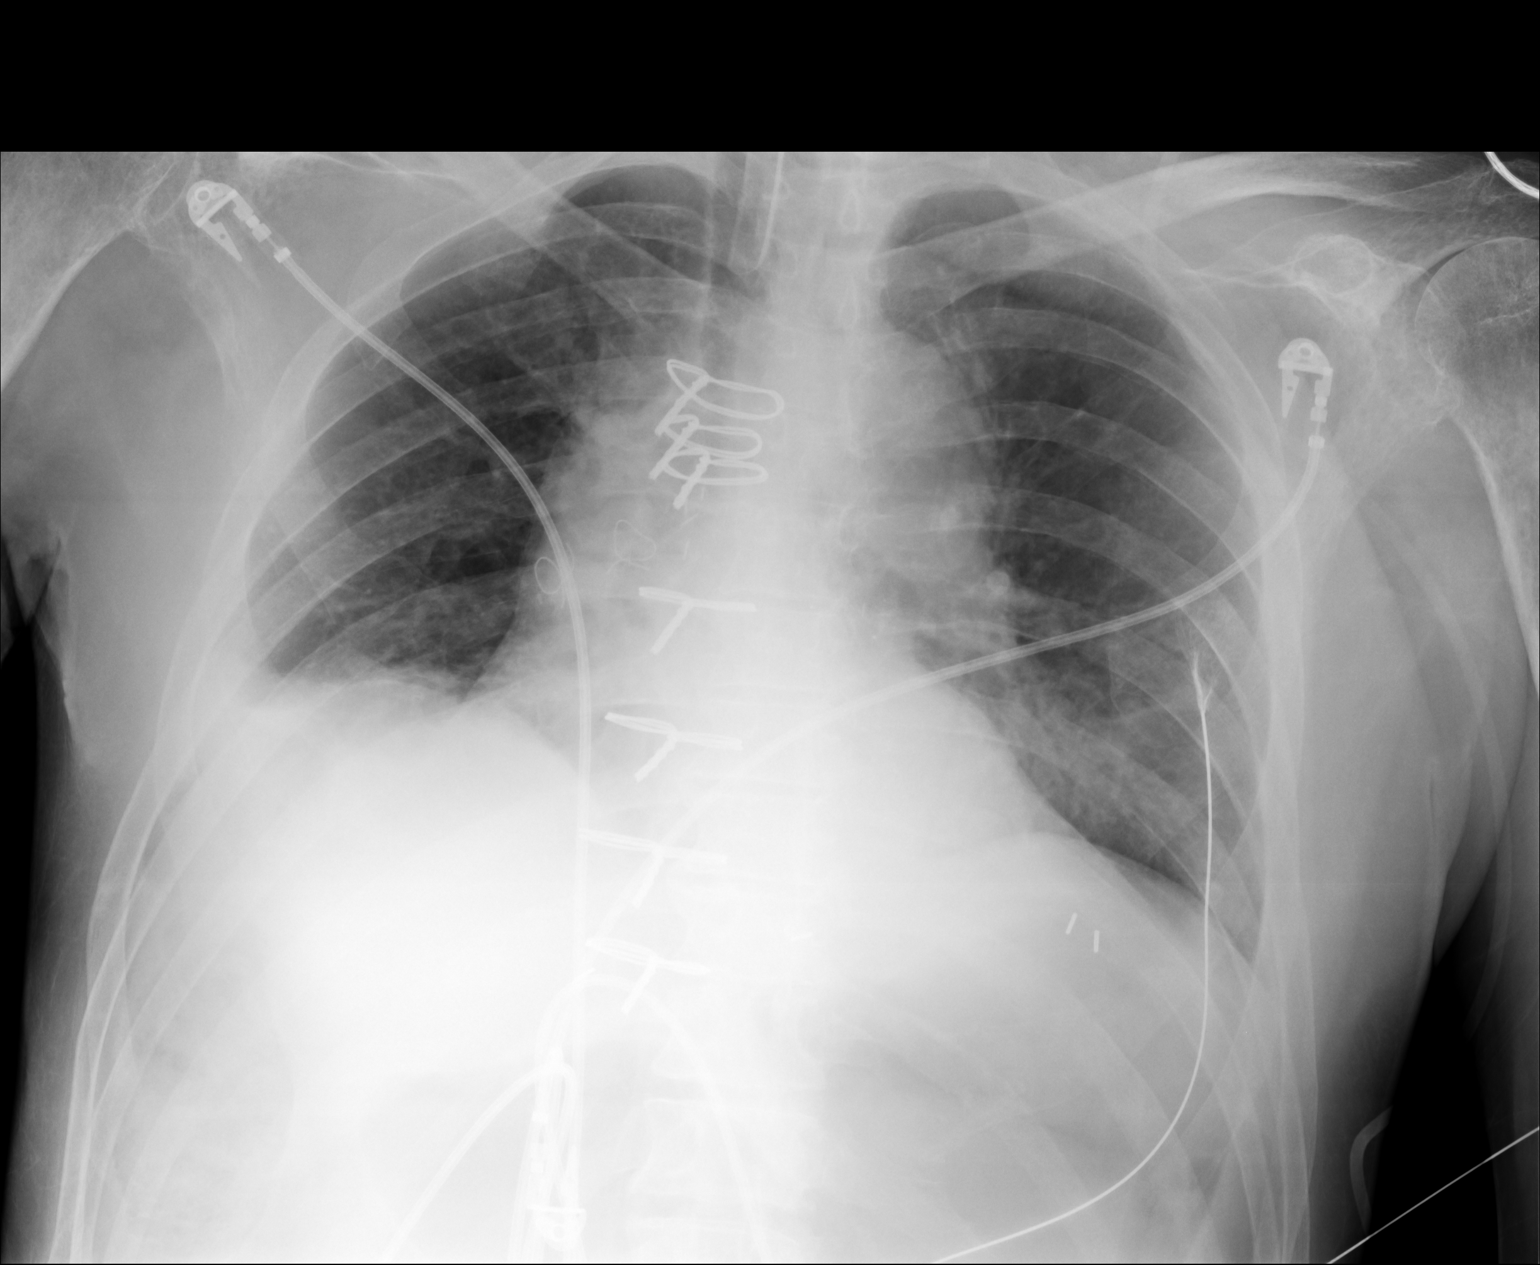

[1 of 1 positions shown; findings below may reference images not displayed]

PROCEDURE:     DXR - DXR PORTABLE CHEST SINGLE VIEW  - July 08, 2011  [DATE]

RESULT:     Comparison is made to the study 17 November, 2007.

An endotracheal tube is present just above the level of the sternoclavicular
joints. Sternotomy wires are present. Hypoinflation is seen with bilateral
lung base atelectasis. No pneumothorax or acute bony abnormality is seen.
IMPRESSION: Hypoinflation with bilateral lung base atelectasis.
Sternotomy wires are present.

## 2012-10-04 IMAGING — CT CT HEAD WITHOUT CONTRAST
2 series · 16 of 30 positions shown, 20 images · non-contrast
Comparison: none

REASON FOR EXAM: altered mental status
COMMENTS:   May transport without cardiac monitor

[Series 2: without · axial · non-contrast · 0.39mm/px · z∈[+412,+536]mm · 13 of 31 slices shown, 17 images]
[im 3/31  brain]
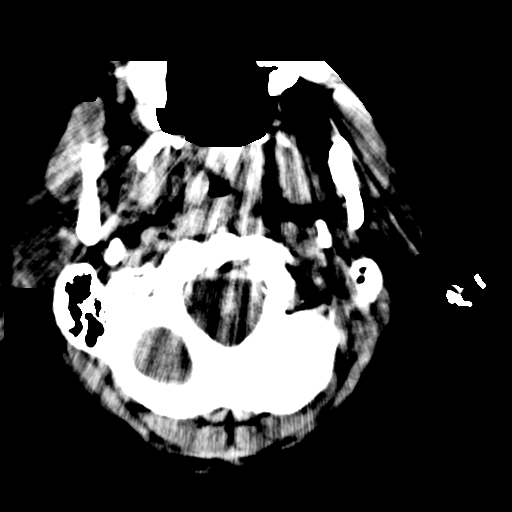
[im 3/31  bone]
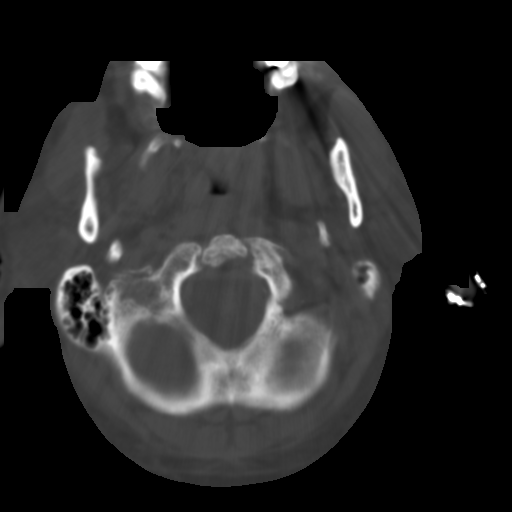
[im 5/31  brain]
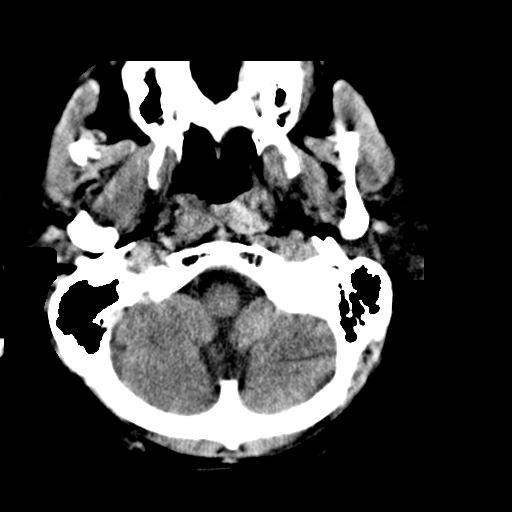
[im 7/31  brain]
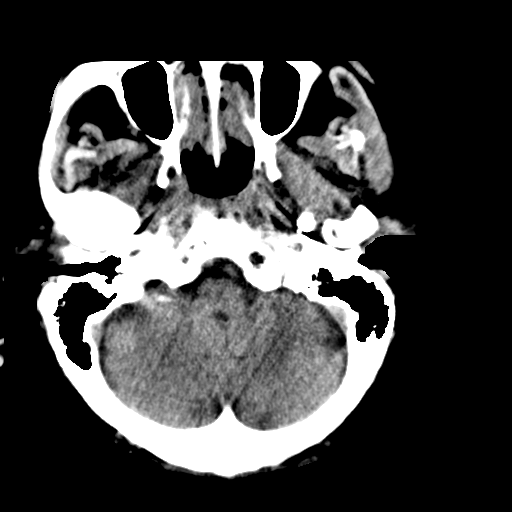
[im 9/31  brain]
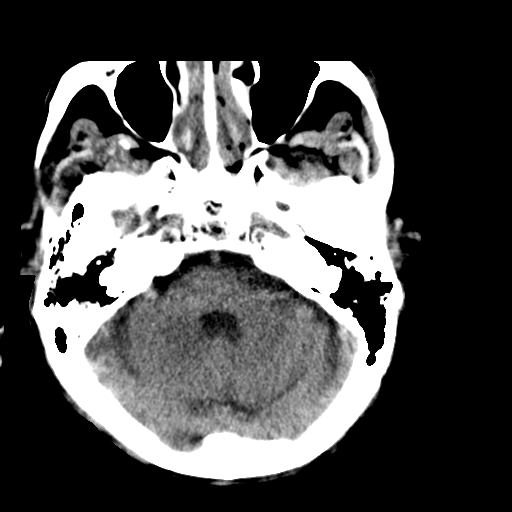
[im 11/31  brain]
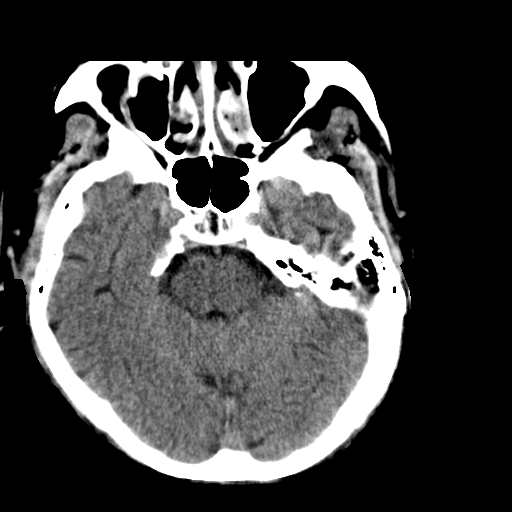
[im 11/31  bone]
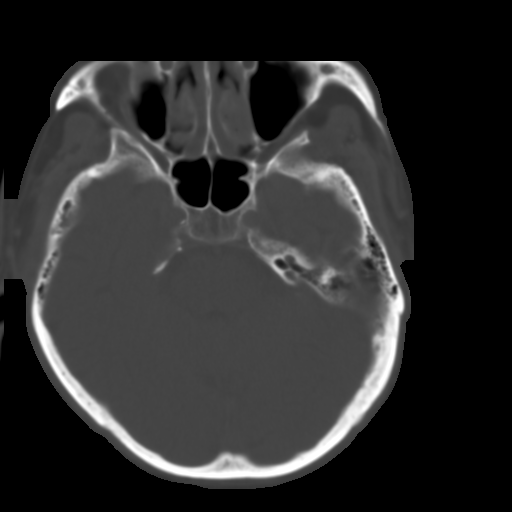
[im 13/31  brain]
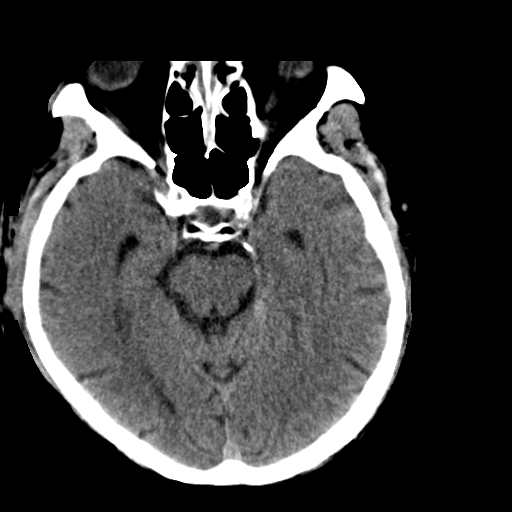
[im 16/31  brain]
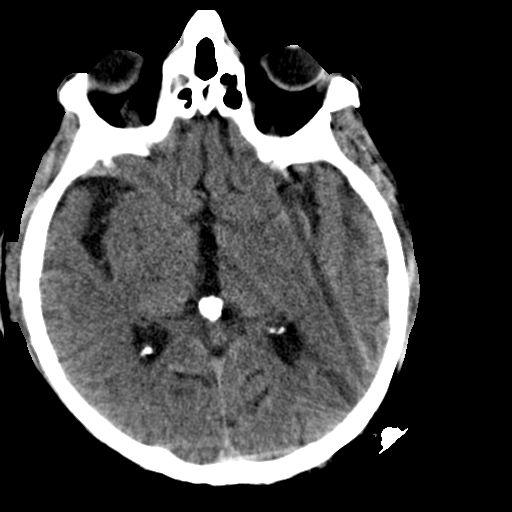
[im 18/31  brain]
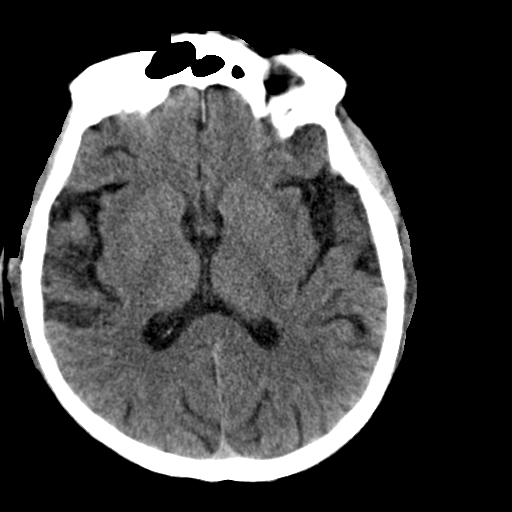
[im 20/31  brain]
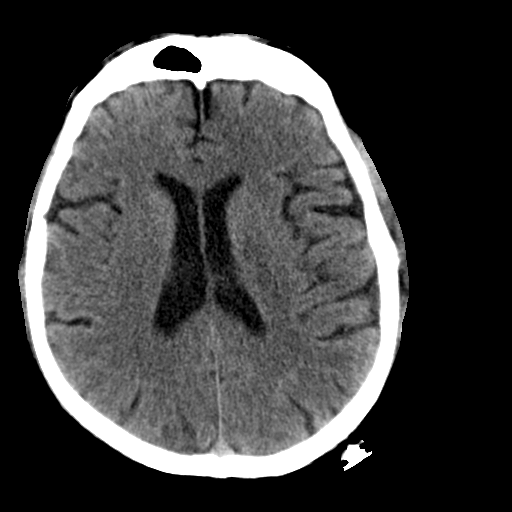
[im 20/31  bone]
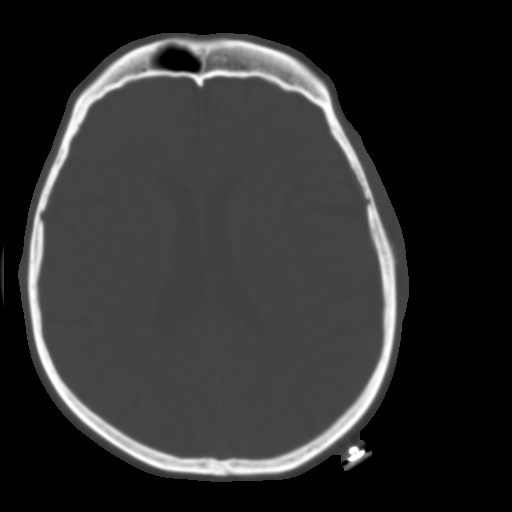
[im 22/31  brain]
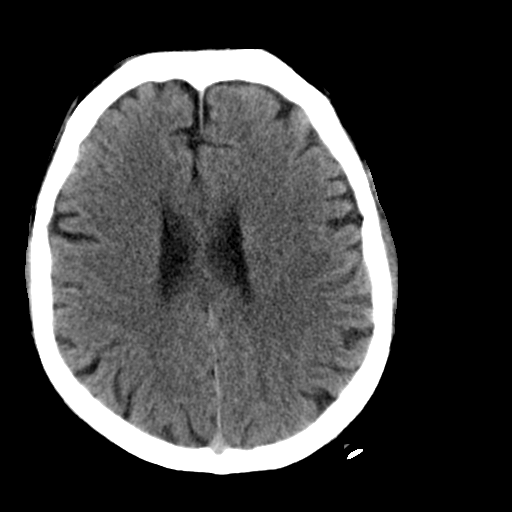
[im 24/31  brain]
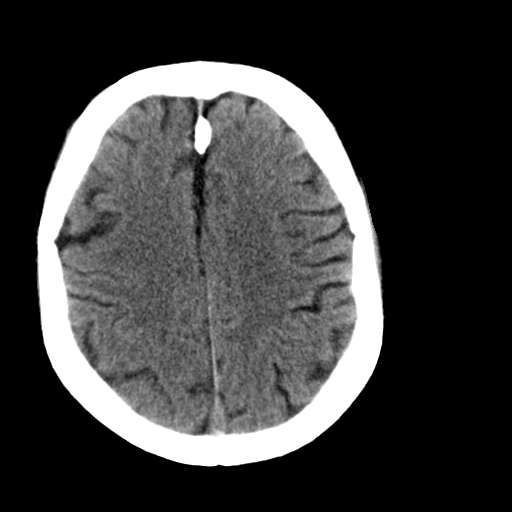
[im 26/31  brain]
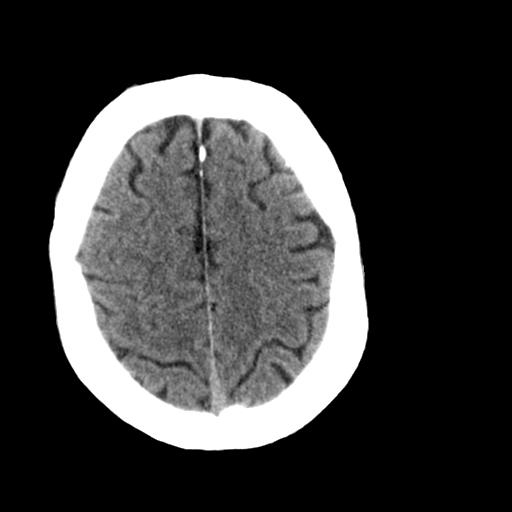
[im 28/31  brain]
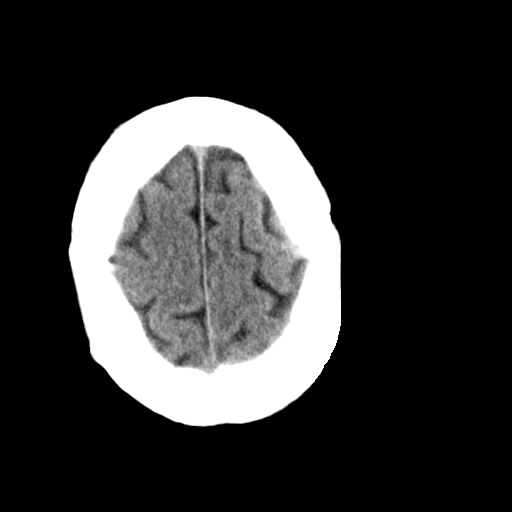
[im 28/31  bone]
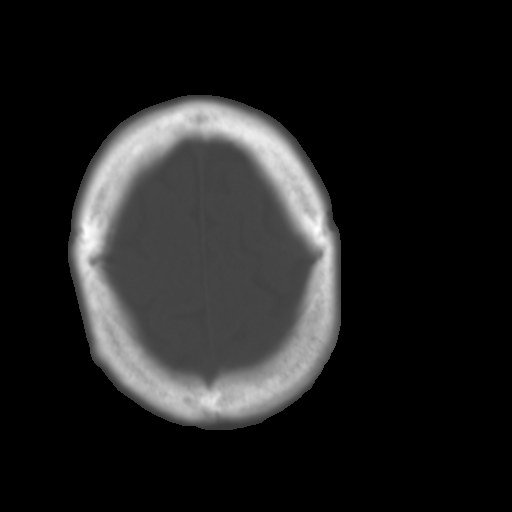

[Series 3: bone · axial · 0.39mm/px · z∈[+412,+452]mm · 3 of 31 slices shown]
[im 3/31  bone]
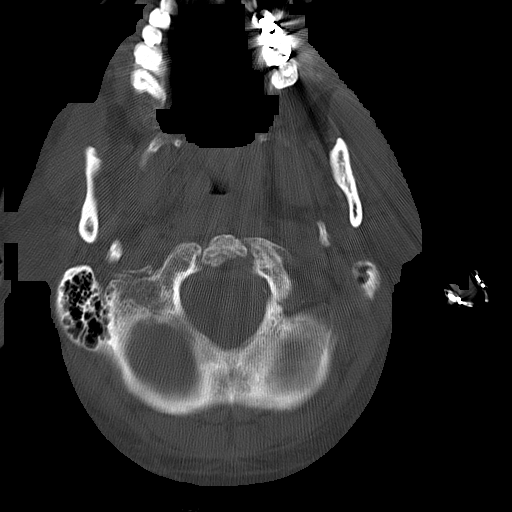
[im 7/31  bone]
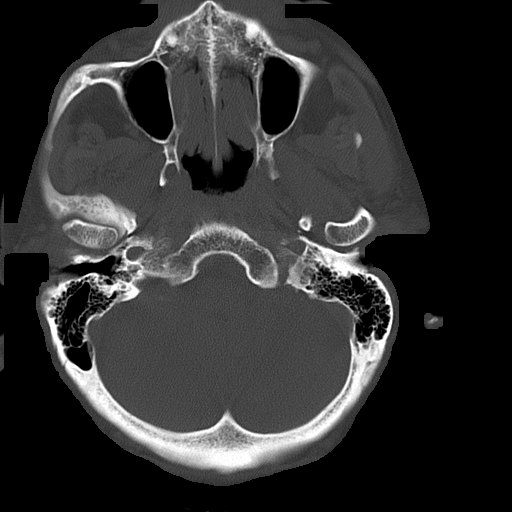
[im 11/31  bone]
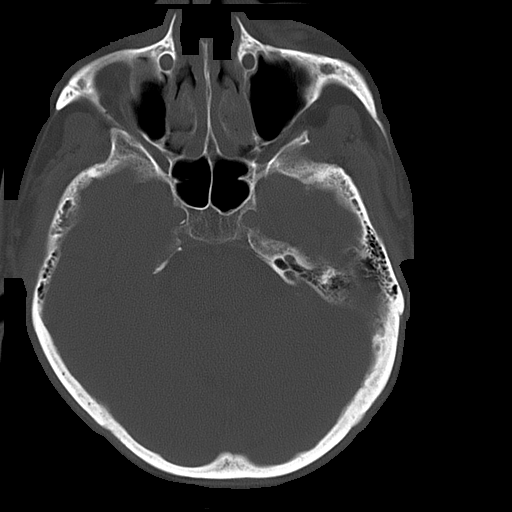

[16 of 30 positions shown; findings below may reference images not displayed]

PROCEDURE:     CT  - CT HEAD WITHOUT CONTRAST  - July 08, 2011  [DATE]

RESULT:     Emergent noncontrast CT of the brain is performed. The patient
has no previous exam for comparison.

The ventricles and sulci appear to be within normal limits for the patient's
age. There is no evidence of intracranial hemorrhage, mass or mass effect.
No acute ischemic event is demonstrated. No large territorial infarct is
evident. Endotracheal tube is partially visualized. The sinuses and mastoid
air cells show normal aeration. The calvarium appears intact.
IMPRESSION: 1. No CT evidence of an acute intracranial abnormality.

## 2012-10-06 IMAGING — CR DG CHEST 1V PORT
1 series · 1 of 1 positions shown · non-contrast
Comparison: none

REASON FOR EXAM: intubated
COMMENTS:

[portable]
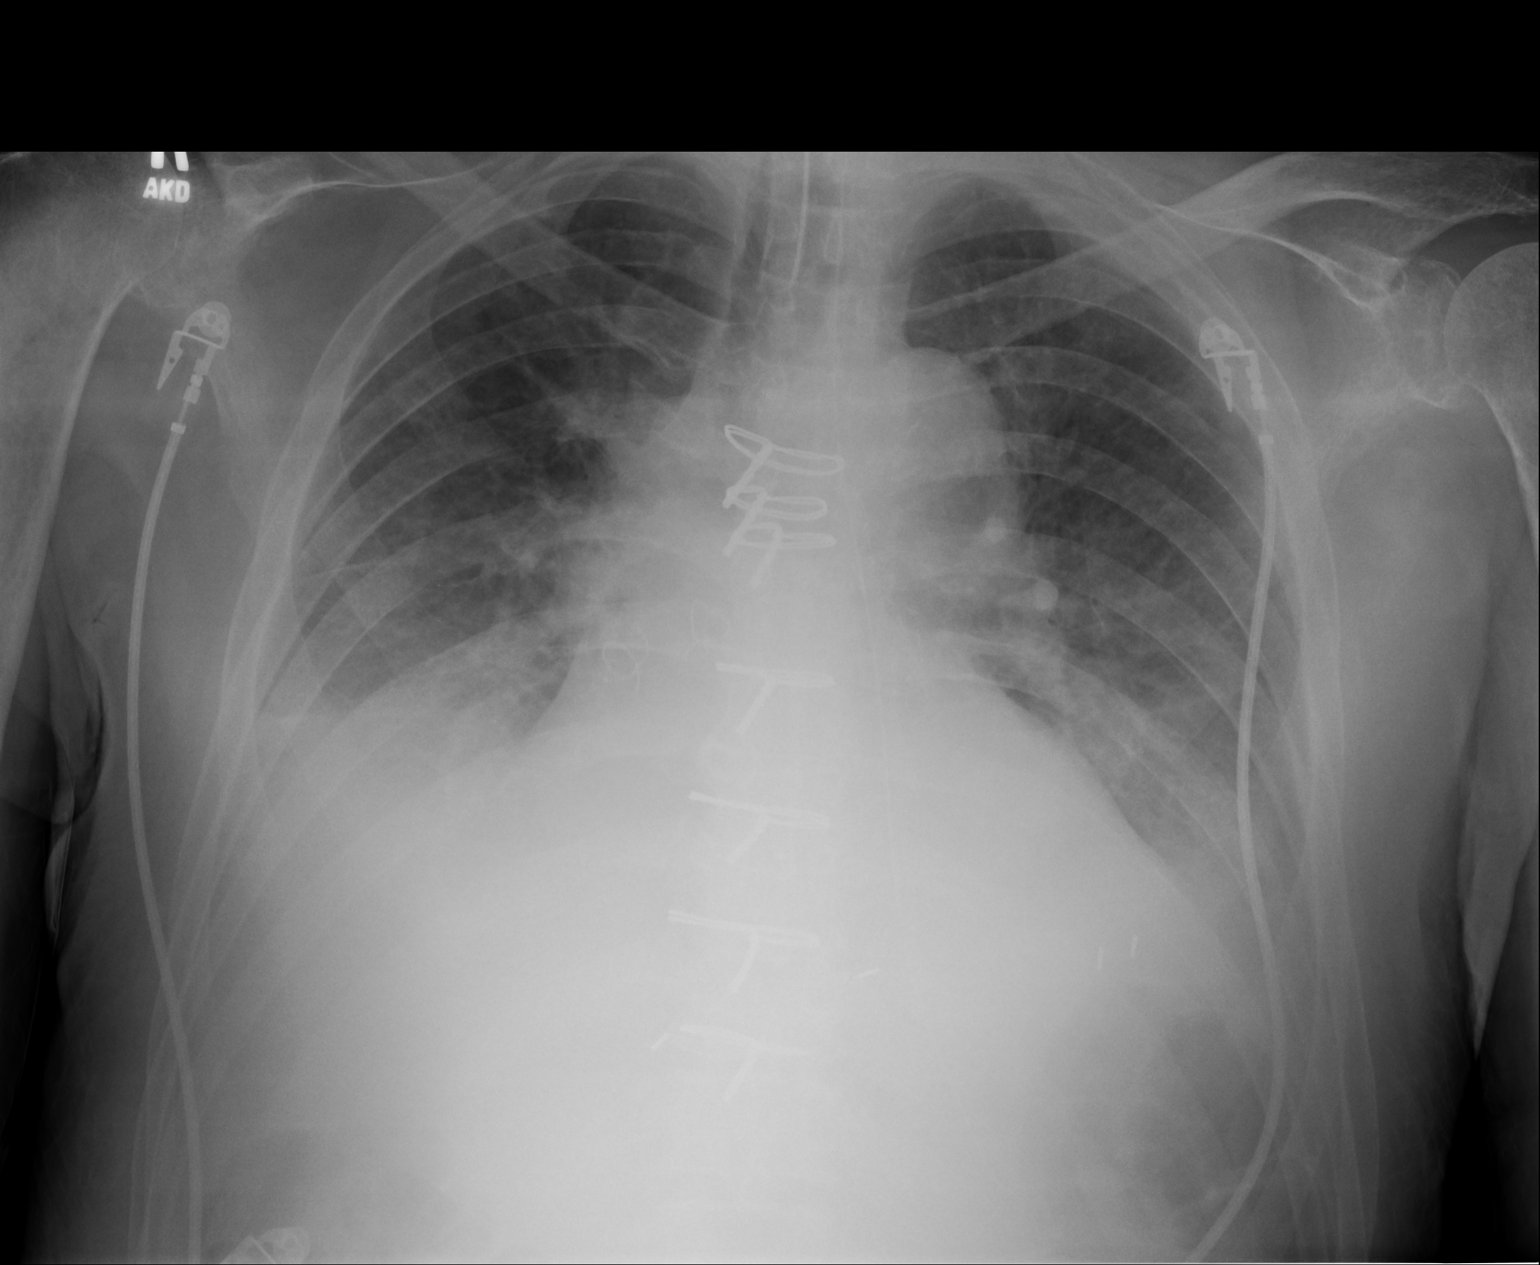

[1 of 1 positions shown; findings below may reference images not displayed]

PROCEDURE:     DXR - DXR PORTABLE CHEST SINGLE VIEW  - July 10, 2011  [DATE]

RESULT:     Comparison is made to study 09 July, 2011.

The lung volumes remain low. The endotracheal tube tip remains approximately
1 cm above the superior margin of the clavicles. The retrocardiac region
remains dense and the hemidiaphragms remain obscured. The perihilar lung
markings remain prominent.
IMPRESSION: Overall there has not been significant interval change
since yesterday's study.

## 2012-10-07 IMAGING — CR DG CHEST 1V PORT
1 series · 1 of 1 positions shown · non-contrast
Comparison: none

REASON FOR EXAM: intubated
COMMENTS:

[portable]
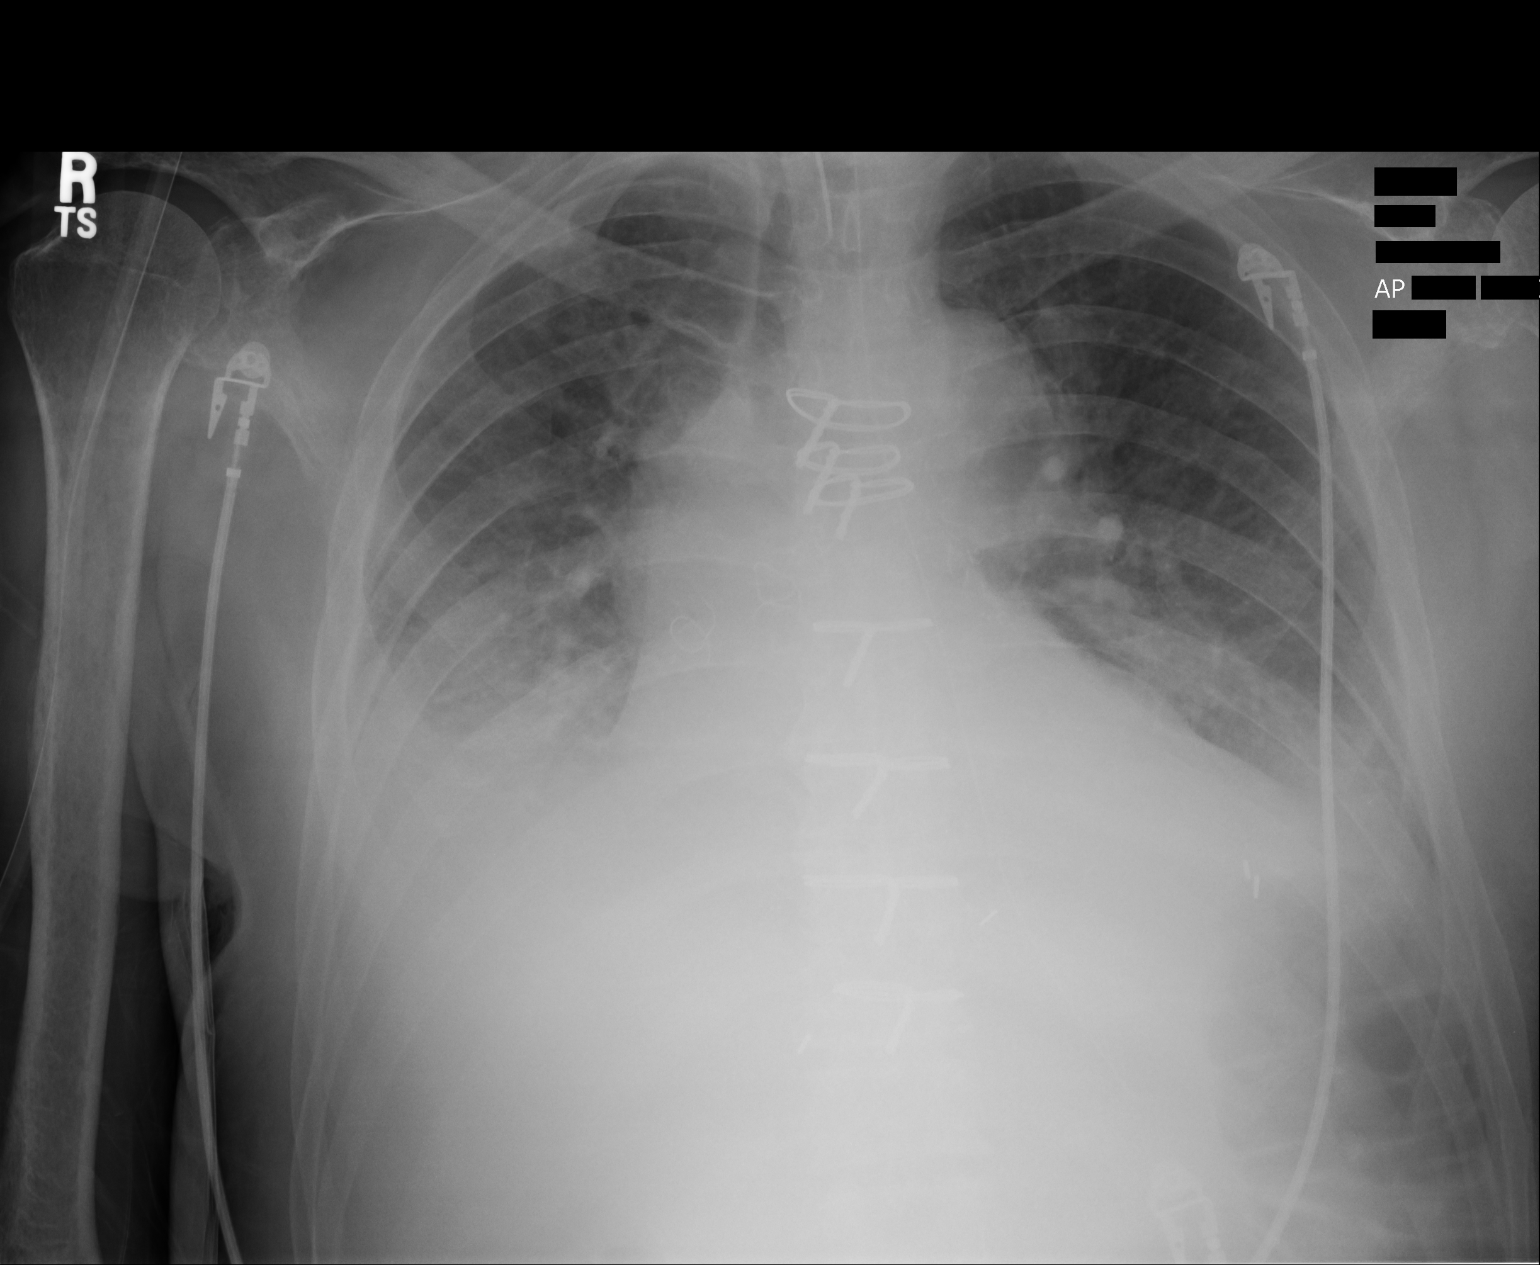

[1 of 1 positions shown; findings below may reference images not displayed]

PROCEDURE:     DXR - DXR PORTABLE CHEST SINGLE VIEW  - July 11, 2011  [DATE]

RESULT:     Comparison is made to the study July 10, 2011. Endotracheal
tube remains in place with tip at the upper level of the sternoclavicular
joints. Sternotomy wires are present. Bilateral pleural effusions and
basilar areas of infiltrate or atelectasis are present. Cardiac monitoring
electrodes are present.
IMPRESSION: Cardiomegaly with pleural effusions and presumed compressive atelectasis.

## 2013-12-31 ENCOUNTER — Telehealth: Payer: Self-pay

## 2013-12-31 NOTE — Telephone Encounter (Signed)
Patient died per Obituary °
